# Patient Record
Sex: Male | Born: 1965 | Race: Black or African American | Hispanic: No | Marital: Married | Smoking: Never smoker
Health system: Southern US, Community
[De-identification: ages and names within clinical notes are randomized; demographics above are authoritative.]

## PROBLEM LIST (undated history)

## (undated) ENCOUNTER — Emergency Department (HOSPITAL_COMMUNITY): Disposition: A | Discharge status: Home / Self Care | Payer: Self-pay

## (undated) DIAGNOSIS — H269 Unspecified cataract: Secondary | ICD-10-CM

## (undated) DIAGNOSIS — J45909 Unspecified asthma, uncomplicated: Secondary | ICD-10-CM

## (undated) DIAGNOSIS — E119 Type 2 diabetes mellitus without complications: Secondary | ICD-10-CM

## (undated) DIAGNOSIS — I10 Essential (primary) hypertension: Secondary | ICD-10-CM

## (undated) DIAGNOSIS — H35039 Hypertensive retinopathy, unspecified eye: Secondary | ICD-10-CM

## (undated) HISTORY — PX: OTHER SURGICAL HISTORY: SHX169

## (undated) HISTORY — DX: Unspecified cataract: H26.9

## (undated) HISTORY — DX: Hypertensive retinopathy, unspecified eye: H35.039

## (undated) HISTORY — DX: Type 2 diabetes mellitus without complications: E11.9

---

## 2005-01-15 ENCOUNTER — Emergency Department (HOSPITAL_COMMUNITY): Admission: EM | Admit: 2005-01-15 | Discharge: 2005-01-15 | Payer: Self-pay | Admitting: Family Medicine

## 2006-06-01 ENCOUNTER — Emergency Department (HOSPITAL_COMMUNITY): Admission: EM | Admit: 2006-06-01 | Discharge: 2006-06-02 | Payer: Self-pay | Admitting: Emergency Medicine

## 2009-05-28 ENCOUNTER — Ambulatory Visit: Payer: Self-pay | Admitting: Vascular Surgery

## 2009-05-28 ENCOUNTER — Inpatient Hospital Stay (HOSPITAL_COMMUNITY)
Admission: EM | Admit: 2009-05-28 | Discharge: 2009-06-05 | Payer: Self-pay | Source: Home / Self Care | Admitting: Emergency Medicine

## 2009-05-28 ENCOUNTER — Encounter (INDEPENDENT_AMBULATORY_CARE_PROVIDER_SITE_OTHER): Payer: Self-pay | Admitting: Emergency Medicine

## 2009-05-28 ENCOUNTER — Ambulatory Visit: Payer: Self-pay | Admitting: Cardiovascular Disease

## 2009-05-30 ENCOUNTER — Encounter (INDEPENDENT_AMBULATORY_CARE_PROVIDER_SITE_OTHER): Payer: Self-pay | Admitting: Internal Medicine

## 2009-05-30 ENCOUNTER — Encounter (INDEPENDENT_AMBULATORY_CARE_PROVIDER_SITE_OTHER): Payer: Self-pay | Admitting: Family Medicine

## 2009-06-02 ENCOUNTER — Ambulatory Visit: Payer: Self-pay | Admitting: Infectious Diseases

## 2010-01-19 ENCOUNTER — Encounter: Admission: RE | Admit: 2010-01-19 | Payer: Self-pay | Source: Home / Self Care | Admitting: Endocrinology

## 2010-01-20 ENCOUNTER — Encounter
Admission: RE | Admit: 2010-01-20 | Discharge: 2010-01-20 | Payer: Self-pay | Source: Home / Self Care | Attending: Family Medicine | Admitting: Family Medicine

## 2010-03-23 LAB — CBC
HCT: 38.8 % — ABNORMAL LOW (ref 39.0–52.0)
HCT: 35.2 % — ABNORMAL LOW (ref 39.0–52.0)
HCT: 41.5 % (ref 39.0–52.0)
Hemoglobin: 13.3 g/dL (ref 13.0–17.0)
Hemoglobin: 11.8 g/dL — ABNORMAL LOW (ref 13.0–17.0)
Hemoglobin: 12.3 g/dL — ABNORMAL LOW (ref 13.0–17.0)
Hemoglobin: 16.1 g/dL (ref 13.0–17.0)
MCHC: 33.7 g/dL (ref 30.0–36.0)
MCHC: 34.3 g/dL (ref 30.0–36.0)
MCHC: 34.2 g/dL (ref 30.0–36.0)
MCHC: 34.5 g/dL (ref 30.0–36.0)
MCHC: 34.6 g/dL (ref 30.0–36.0)
MCHC: 34.9 g/dL (ref 30.0–36.0)
MCHC: 34.9 g/dL (ref 30.0–36.0)
MCV: 87.4 fL (ref 78.0–100.0)
MCV: 87.1 fL (ref 78.0–100.0)
MCV: 87.1 fL (ref 78.0–100.0)
MCV: 87.8 fL (ref 78.0–100.0)
MCV: 88 fL (ref 78.0–100.0)
Platelets: 258 10*3/uL (ref 150–400)
Platelets: 219 10*3/uL (ref 150–400)
Platelets: 247 10*3/uL (ref 150–400)
RBC: 3.9 MIL/uL — ABNORMAL LOW (ref 4.22–5.81)
RBC: 3.98 MIL/uL — ABNORMAL LOW (ref 4.22–5.81)
RBC: 4.01 MIL/uL — ABNORMAL LOW (ref 4.22–5.81)
RBC: 5.32 MIL/uL (ref 4.22–5.81)
RDW: 14.1 % (ref 11.5–15.5)
RDW: 14.4 % (ref 11.5–15.5)
RDW: 14.4 % (ref 11.5–15.5)
WBC: 9.6 10*3/uL (ref 4.0–10.5)
WBC: 13.3 10*3/uL — ABNORMAL HIGH (ref 4.0–10.5)

## 2010-03-23 LAB — COMPREHENSIVE METABOLIC PANEL
AST: 38 U/L — ABNORMAL HIGH (ref 0–37)
Albumin: 3.3 g/dL — ABNORMAL LOW (ref 3.5–5.2)
CO2: 25 mEq/L (ref 19–32)
Chloride: 107 mEq/L (ref 96–112)
Creatinine, Ser: 1.22 mg/dL (ref 0.4–1.5)
GFR calc Af Amer: >60 mL/min (ref >60)
GFR calc non Af Amer: >60 mL/min (ref >60)
Glucose, Bld: 109 mg/dL — ABNORMAL HIGH (ref 70–99)
Potassium: 3.7 mEq/L (ref 3.5–5.1)
Total Protein: 6.8 g/dL (ref 6.0–8.3)

## 2010-03-23 LAB — BASIC METABOLIC PANEL
BUN: 5 mg/dL — ABNORMAL LOW (ref 6–23)
BUN: 6 mg/dL (ref 6–23)
CO2: 26 mEq/L (ref 19–32)
CO2: 22 mEq/L (ref 19–32)
CO2: 24 mEq/L (ref 19–32)
CO2: 26 mEq/L (ref 19–32)
Calcium: 8.9 mg/dL (ref 8.4–10.5)
Calcium: 8.9 mg/dL (ref 8.4–10.5)
Chloride: 106 mEq/L (ref 96–112)
Chloride: 107 mEq/L (ref 96–112)
Creatinine, Ser: 1.02 mg/dL (ref 0.4–1.5)
GFR calc Af Amer: >60 mL/min (ref >60)
GFR calc Af Amer: >60 mL/min (ref >60)
GFR calc non Af Amer: >60 mL/min (ref >60)
GFR calc non Af Amer: >60 mL/min (ref >60)
Glucose, Bld: 99 mg/dL (ref 70–99)
Glucose, Bld: 104 mg/dL — ABNORMAL HIGH (ref 70–99)
Glucose, Bld: 115 mg/dL — ABNORMAL HIGH (ref 70–99)
Glucose, Bld: 123 mg/dL — ABNORMAL HIGH (ref 70–99)
Potassium: 3.8 mEq/L (ref 3.5–5.1)
Potassium: 3.4 mEq/L — ABNORMAL LOW (ref 3.5–5.1)
Potassium: 3.9 mEq/L (ref 3.5–5.1)
Sodium: 134 mEq/L — ABNORMAL LOW (ref 135–145)

## 2010-03-23 LAB — DIFFERENTIAL
Basophils Absolute: 0.1 10*3/uL (ref 0.0–0.1)
Basophils Relative: 0 % (ref 0–1)
Basophils Relative: 1 % (ref 0–1)
Eosinophils Absolute: 0 10*3/uL (ref 0.0–0.7)
Eosinophils Absolute: 0.2 10*3/uL (ref 0.0–0.7)
Eosinophils Relative: 1 % (ref 0–5)
Lymphs Abs: 2.6 10*3/uL (ref 0.7–4.0)
Monocytes Absolute: 1.5 10*3/uL — ABNORMAL HIGH (ref 0.1–1.0)
Monocytes Relative: 10 % (ref 3–12)
Neutrophils Relative %: 77 % (ref 43–77)

## 2010-03-23 LAB — PROTIME-INR
INR: 1.5 — ABNORMAL HIGH (ref 0.00–1.49)
INR: 1.67 — ABNORMAL HIGH (ref 0.00–1.49)
Prothrombin Time: 18.5 seconds — ABNORMAL HIGH (ref 11.6–15.2)
Prothrombin Time: 15.1 seconds (ref 11.6–15.2)
Prothrombin Time: 17.5 seconds — ABNORMAL HIGH (ref 11.6–15.2)
Prothrombin Time: 17.9 seconds — ABNORMAL HIGH (ref 11.6–15.2)

## 2010-03-23 LAB — HIV ANTIBODY (ROUTINE TESTING W REFLEX): HIV: NONREACTIVE

## 2010-03-23 LAB — ANAEROBIC CULTURE

## 2010-03-23 LAB — LIPID PANEL
HDL: 34 mg/dL — ABNORMAL LOW (ref >39)
Triglycerides: 114 mg/dL (ref <150)
VLDL: 23 mg/dL (ref 0–40)

## 2010-03-23 LAB — CULTURE, BLOOD (ROUTINE X 2): Culture: NO GROWTH

## 2010-03-23 LAB — RAPID URINE DRUG SCREEN, HOSP PERFORMED
Amphetamines: NOT DETECTED
Cocaine: POSITIVE — AB
Opiates: NOT DETECTED
Tetrahydrocannabinol: NOT DETECTED

## 2010-03-23 LAB — WOUND CULTURE

## 2010-03-23 LAB — ETHANOL: Alcohol, Ethyl (B): <5 mg/dL (ref 0–10)

## 2010-03-23 LAB — SEDIMENTATION RATE: Sed Rate: 35 mm/hr — ABNORMAL HIGH (ref 0–16)

## 2010-03-23 LAB — VANCOMYCIN, TROUGH: Vancomycin Tr: 5.9 ug/mL — ABNORMAL LOW (ref 10.0–20.0)

## 2012-04-30 IMAGING — CT CT HEAD W/O CM
1 of 2 series · 13 of 30 positions shown, 17 images · non-contrast
Comparison: None.

CLINICAL DATA: Unsteady gait and diffuse weakness.

CT HEAD WITHOUT CONTRAST
TECHNIQUE: Contiguous axial images were obtained from the base of
the skull through the vertex without contrast.

[Series 2: brain · axial · 0.49mm/px · z∈[+177,+311]mm · 13 of 32 slices shown, 17 images]
[im 3/32  brain]
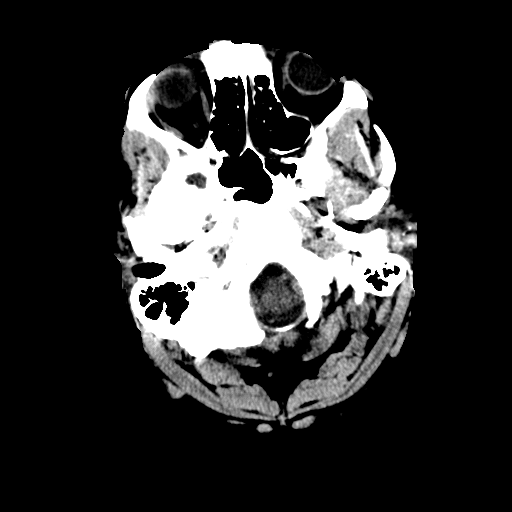
[im 3/32  bone]
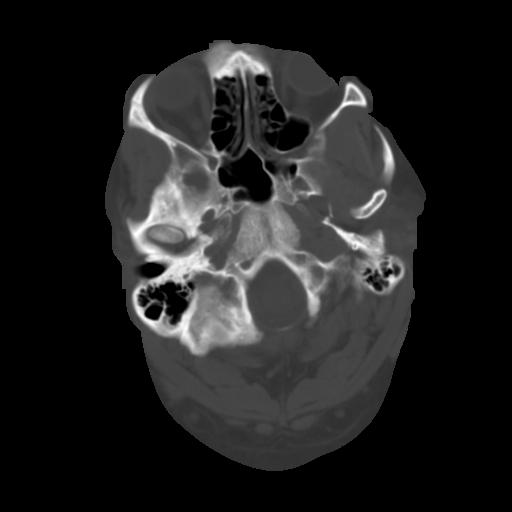
[im 5/32  brain]
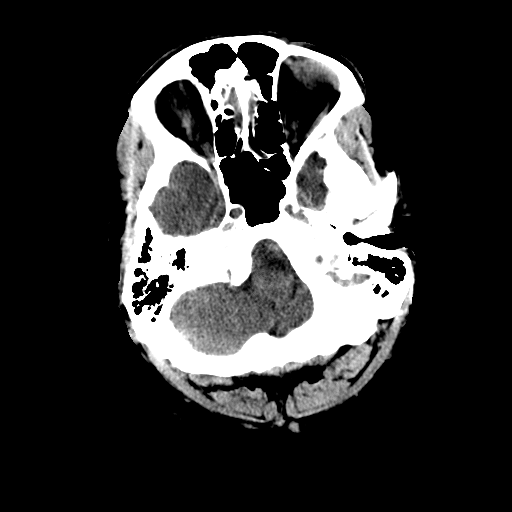
[im 7/32  brain]
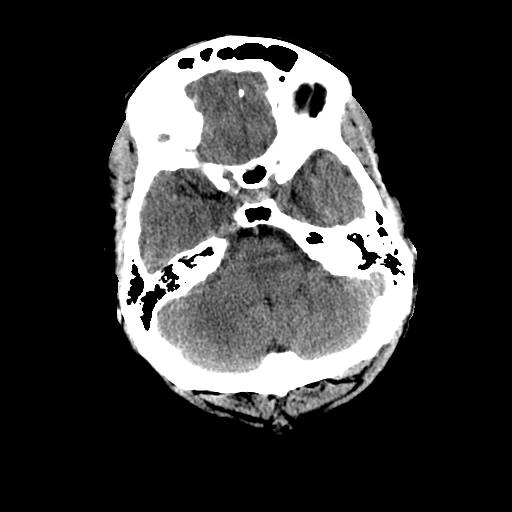
[im 9/32  brain]
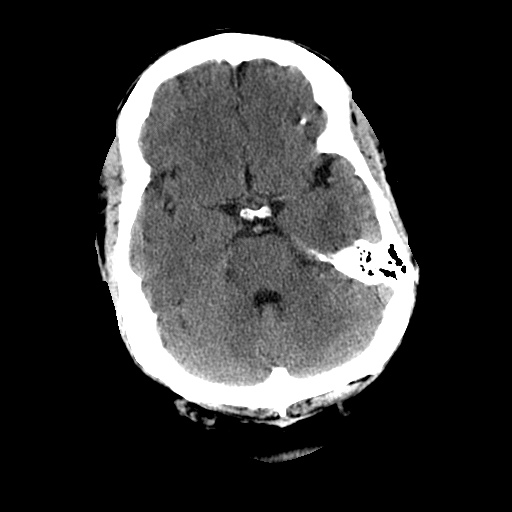
[im 12/32  brain]
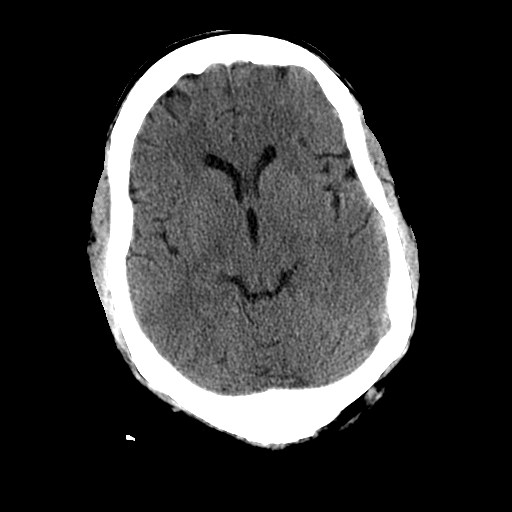
[im 12/32  bone]
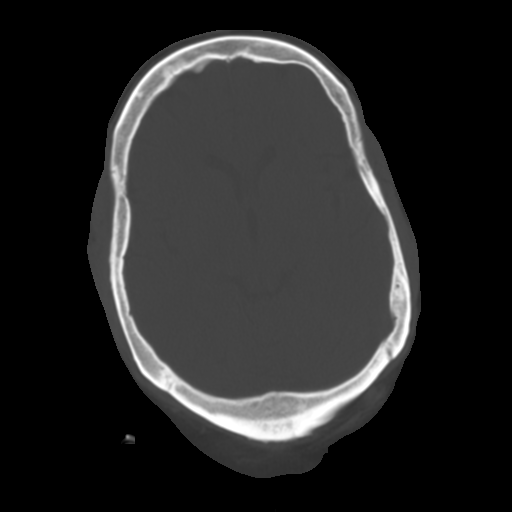
[im 14/32  brain]
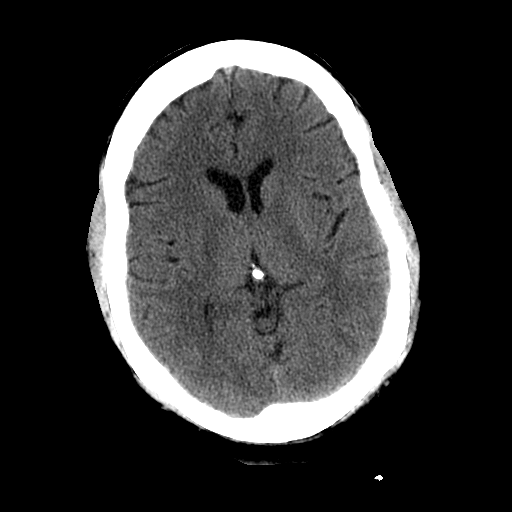
[im 16/32  brain]
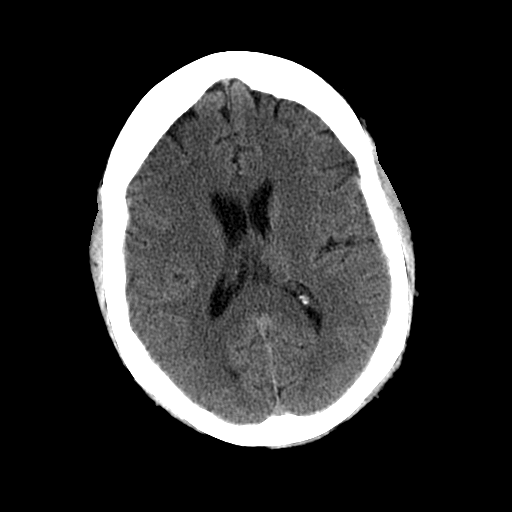
[im 18/32  brain]
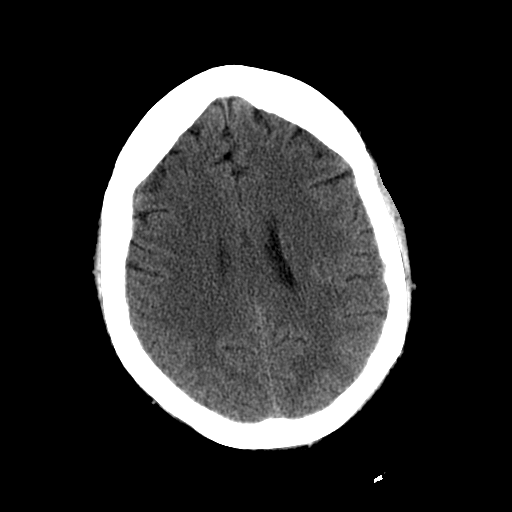
[im 20/32  brain]
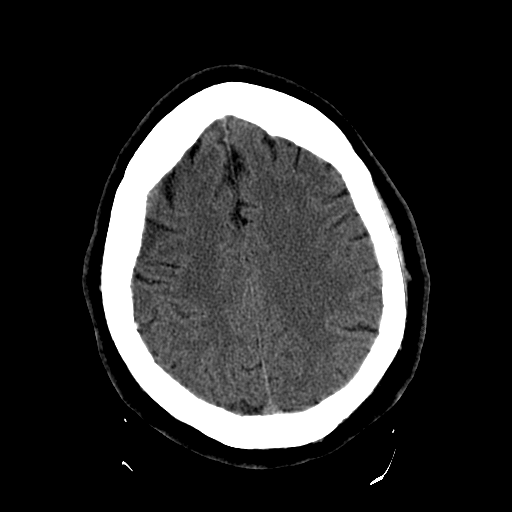
[im 20/32  bone]
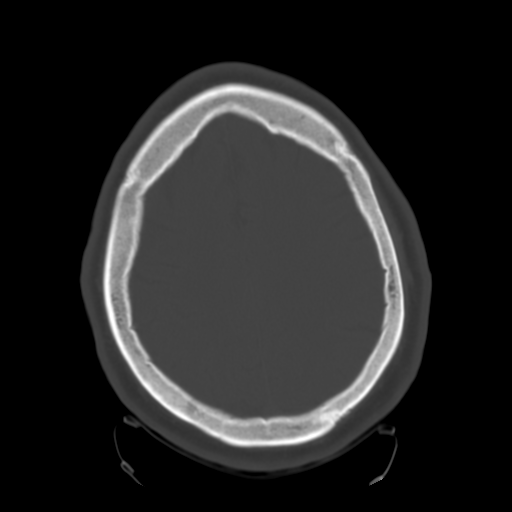
[im 23/32  brain]
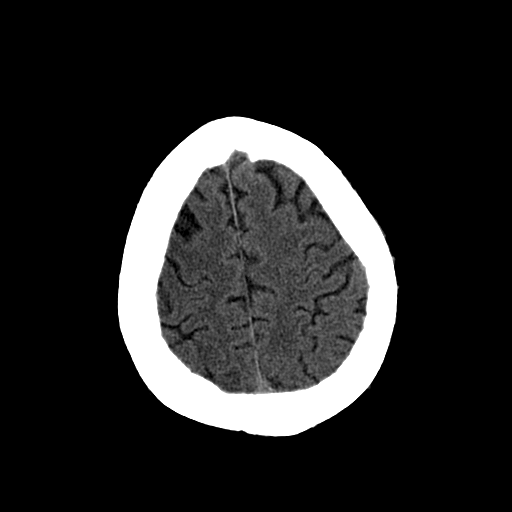
[im 25/32  brain]
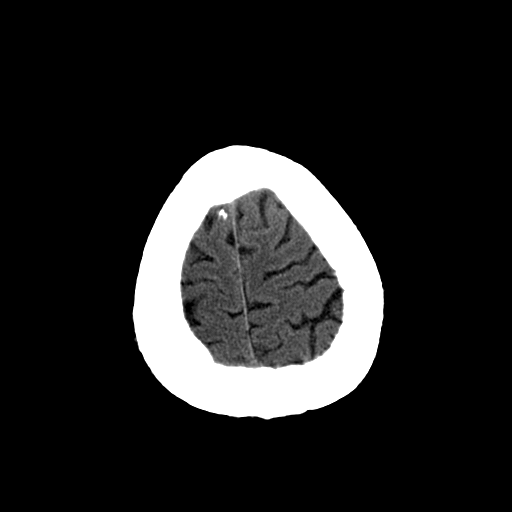
[im 27/32  brain]
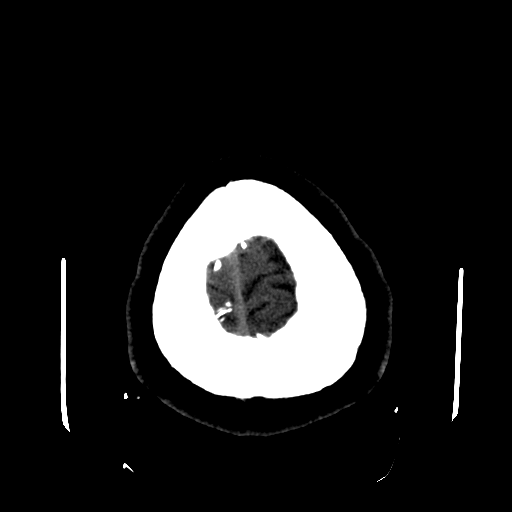
[im 29/32  brain]
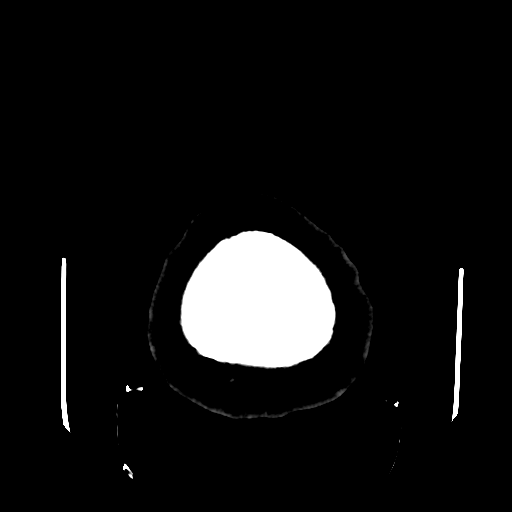
[im 29/32  bone]
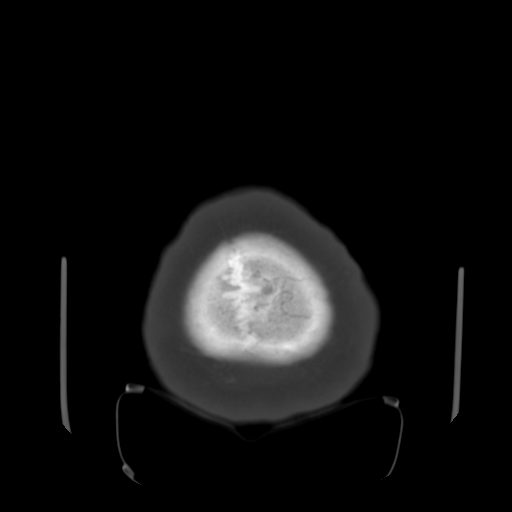

[13 of 30 positions shown; findings below may reference images not displayed]

FINDINGS: There is no evidence of acute infarction, mass lesion, or
intra- or extra-axial hemorrhage on CT.

The posterior fossa, including the cerebellum, brainstem and fourth
ventricle, is within normal limits.  The third and lateral
ventricles, and basal ganglia are unremarkable in appearance.  The
cerebral hemispheres are symmetric in appearance, with normal gray-
white differentiation.  No mass effect or midline shift is seen.

There is no evidence of fracture; visualized osseous structures are
unremarkable in appearance.  The visualized portions of the orbits
are within normal limits.  The paranasal sinuses and mastoid air
cells are well-aerated.  No significant soft tissue abnormalities
are seen. Mild focal soft tissue calcifications are noted at a skin
fold overlying the occiput.
IMPRESSION: Unremarkable noncontrast CT of the head.

## 2012-12-23 IMAGING — US US EXTREM  UP VENOUS*L*
1 series · 14 of 24 positions shown · non-contrast
Comparison: None.

CLINICAL DATA: History of left upper extremity DVT and the
brachial vein.  Follow up after anticoagulation therapy.

LEFT UPPER EXTREMITY VENOUS DOPPLER ULTRASOUND 01/20/2010:
TECHNIQUE: Gray-scale sonography with compression, as well as
color and duplex Doppler ultrasound, were performed to evaluate the
deep venous system from the level of  the elbow centrally to
include the subclavian vein.  The left internal jugular vein was
also evaluated.  Evaluation also included physiologic response to
augmentation.

[Series 1: us extrem up venous*left* · 14 of 36 slices shown]
[im 1/36]
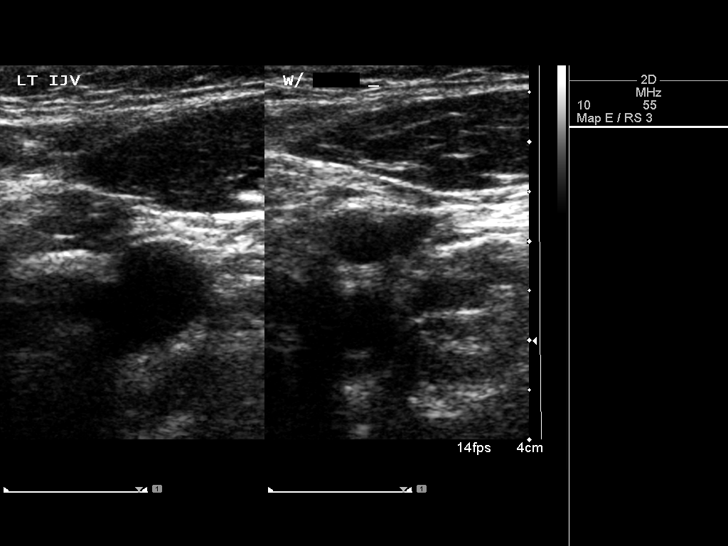
[im 4/36]
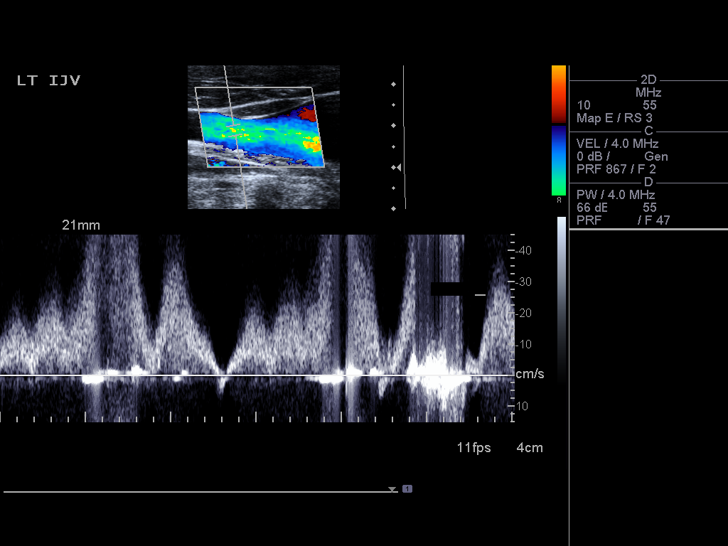
[im 7/36]
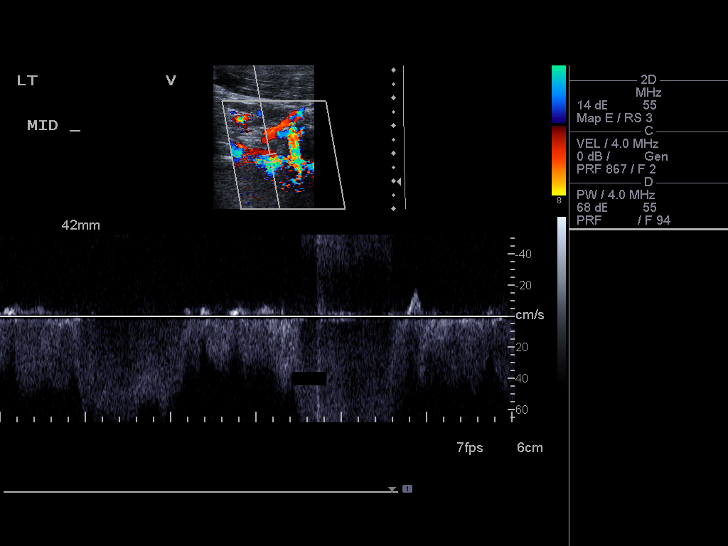
[im 10/36]
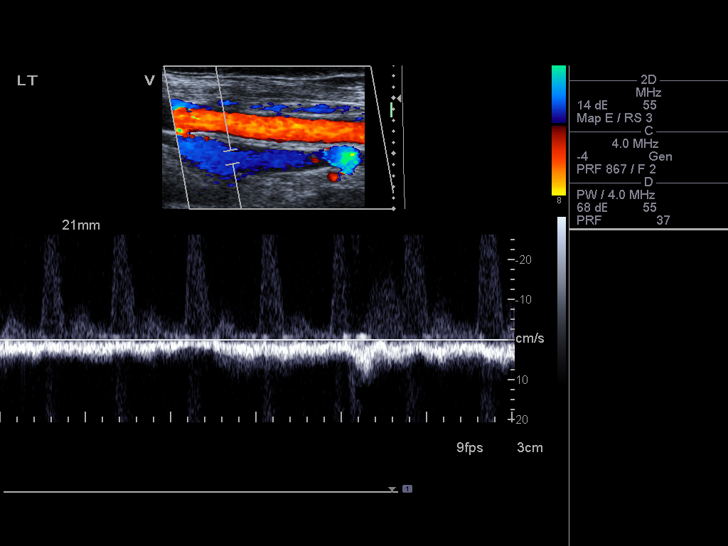
[im 11/36]
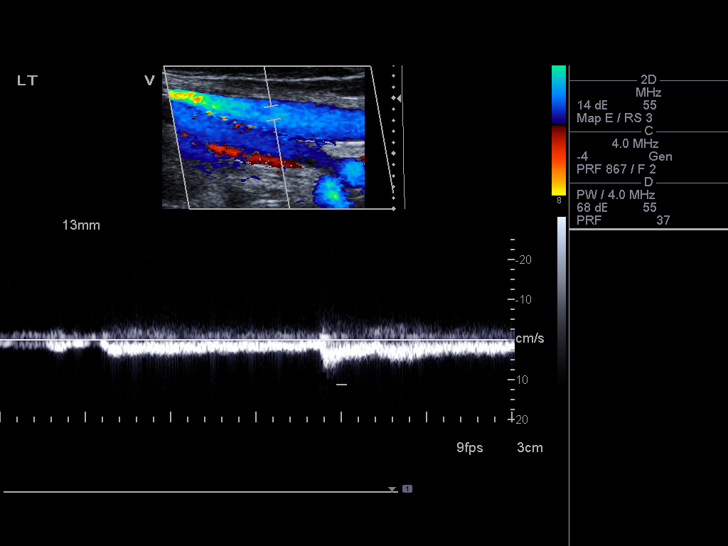
[im 14/36]
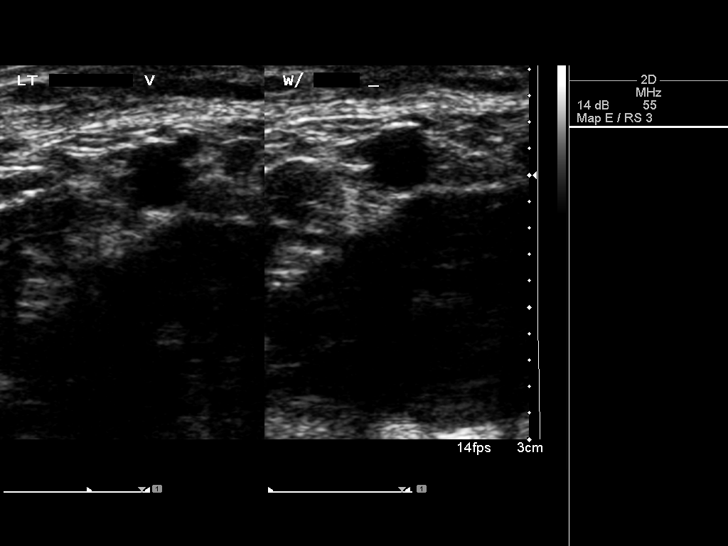
[im 17/36]
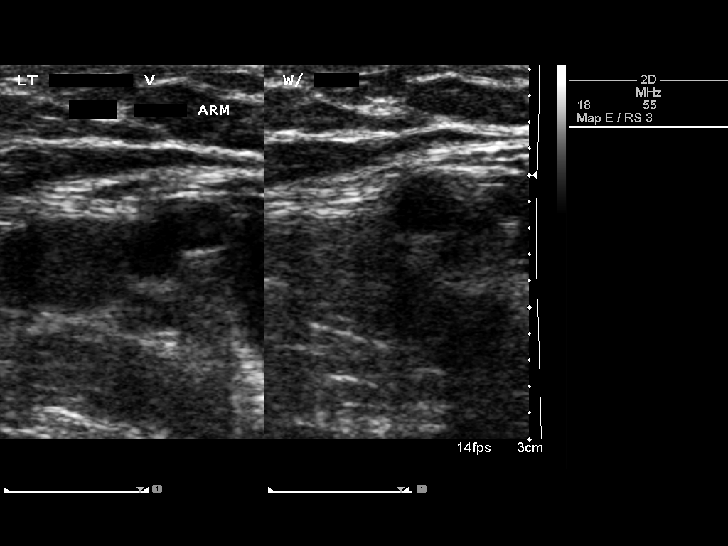
[im 19/36]
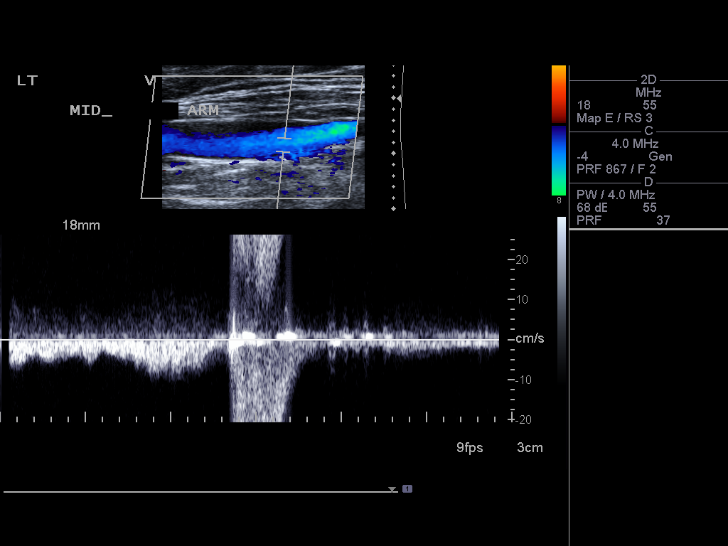
[im 22/36]
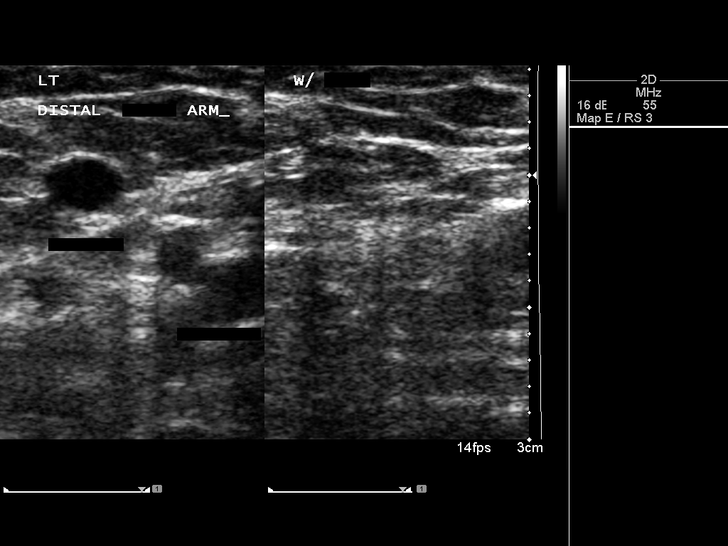
[im 25/36]
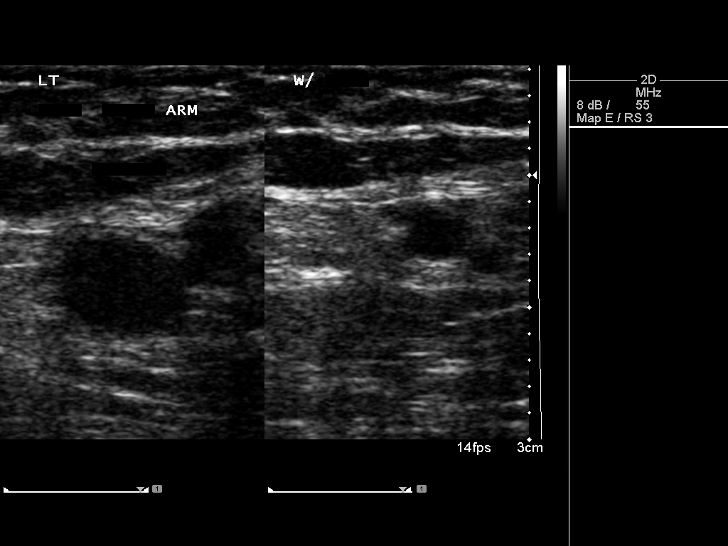
[im 28/36]
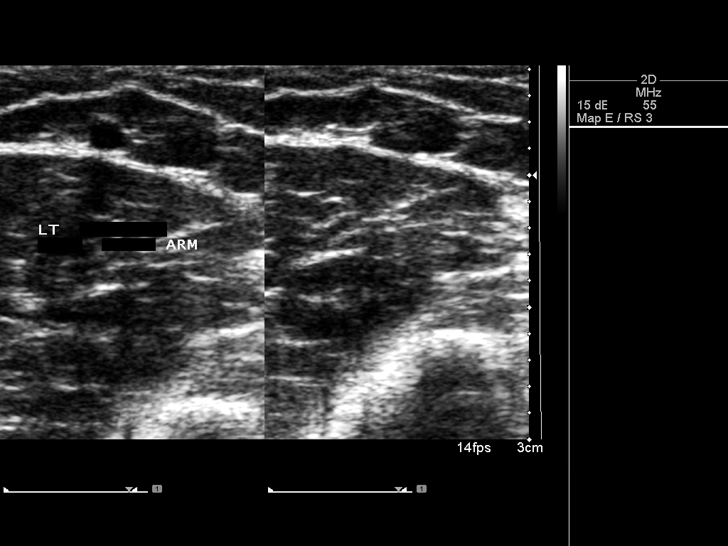
[im 29/36]
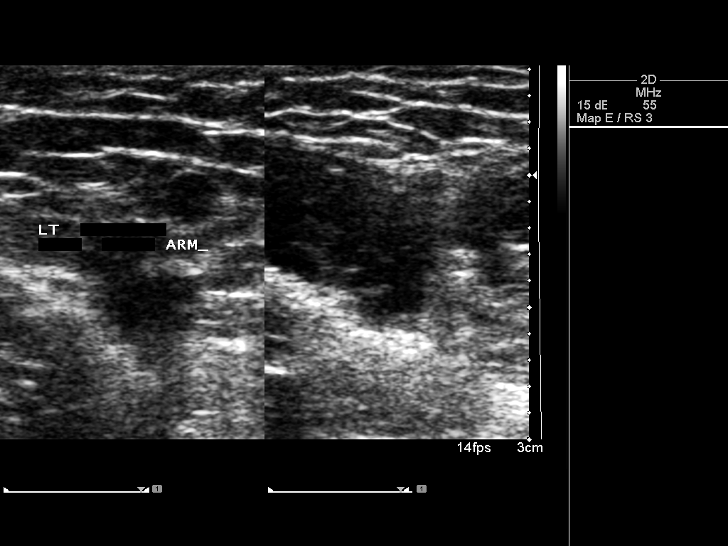
[im 32/36]
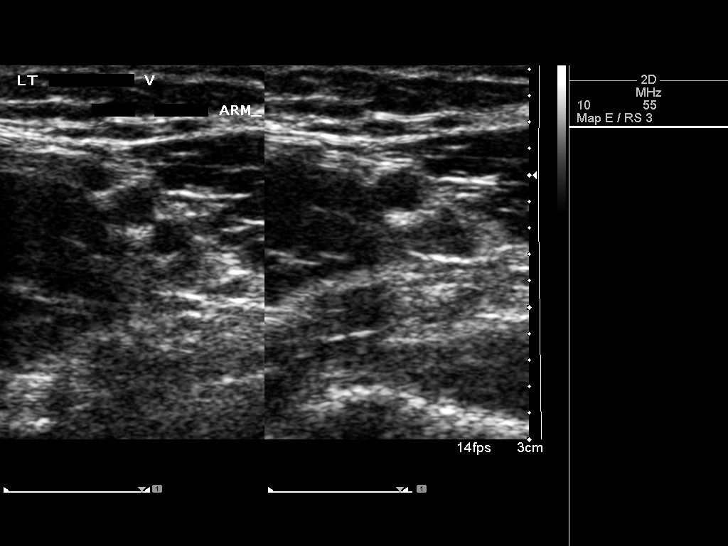
[im 36/36]
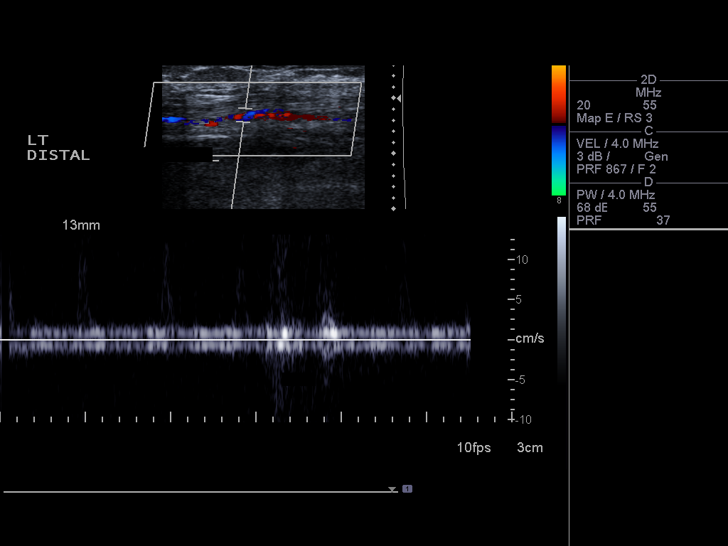

[14 of 24 positions shown; findings below may reference images not displayed]

FINDINGS: All of the visualized deep veins in the left upper
extremity demonstrate normal Doppler signal, normal
compressibility, normal phasicity, and normal physiologic response
to augmentation.  No gray scale filling defects were identified.
The left internal jugular vein is similarly patent.  Specifically,
no evidence of residual DVT involving the left brachial vein.
IMPRESSION: No evidence of left upper extremity DVT.  Specifically, no evidence
of residual DVT in the left brachial vein.

## 2015-03-26 ENCOUNTER — Other Ambulatory Visit: Payer: Self-pay | Admitting: Family Medicine

## 2015-03-26 DIAGNOSIS — M79652 Pain in left thigh: Secondary | ICD-10-CM

## 2015-03-31 ENCOUNTER — Ambulatory Visit
Admission: RE | Admit: 2015-03-31 | Discharge: 2015-03-31 | Disposition: A | Discharge status: Home / Self Care | Payer: 59 | Source: Ambulatory Visit | Attending: Family Medicine | Admitting: Family Medicine

## 2015-03-31 DIAGNOSIS — M79652 Pain in left thigh: Secondary | ICD-10-CM

## 2016-02-29 ENCOUNTER — Encounter (HOSPITAL_COMMUNITY): Payer: Self-pay | Admitting: *Deleted

## 2016-02-29 ENCOUNTER — Ambulatory Visit (HOSPITAL_COMMUNITY)
Admission: EM | Admit: 2016-02-29 | Discharge: 2016-02-29 | Disposition: A | Discharge status: Home / Self Care | Payer: Worker's Compensation | Attending: Family Medicine | Admitting: Family Medicine

## 2016-02-29 DIAGNOSIS — I10 Essential (primary) hypertension: Secondary | ICD-10-CM | POA: Diagnosis not present

## 2016-02-29 DIAGNOSIS — M25562 Pain in left knee: Secondary | ICD-10-CM

## 2016-02-29 HISTORY — DX: Essential (primary) hypertension: I10

## 2016-02-29 HISTORY — DX: Unspecified asthma, uncomplicated: J45.909

## 2016-02-29 MED ORDER — NAPROXEN 500 MG PO TABS: 500.0000 mg | ORAL_TABLET | Freq: Two times a day (BID) | ORAL | 0 refills | Status: AC

## 2016-02-29 MED ORDER — TRAMADOL HCL 50 MG PO TABS: 50.0000 mg | ORAL_TABLET | Freq: Four times a day (QID) | ORAL | 0 refills | Status: AC | PRN

## 2016-02-29 NOTE — ED Provider Notes (Signed)
CSN: [id number]     Arrival date & time 02/29/16  1402 History   First MD Initiated Contact with Patient 02/29/16 1436     Chief Complaint  Patient presents with  . Knee Pain   (Consider location/radiation/quality/duration/timing/severity/associated sxs/prior Treatment) Very pleasant 51 yo gentleman presents with left knee pain. He fell at work a month ago and at that time [place] Xrays were reported as normal. He continues to have pain, especially at night and therefore when he called his place of employment he was told to come here. He denies swelling, erythema or warmth. Pain with ambulation is noted. He feels most of his pain is medial and along the front. Pain with flexion is also noted.       Past Medical History:  Diagnosis Date  . Asthma   . Hypertension    Past Surgical History:  Procedure Laterality Date  . arm surgery     No family history on file. Social History  Substance Use Topics  . Smoking status: Never Smoker  . Smokeless tobacco: Not on file  . Alcohol use No    Review of Systems  All other systems reviewed and are negative.   Allergies  Patient has no known allergies.  Home Medications   Prior to Admission medications   Medication Sig Start Date End Date Taking? Authorizing Provider  albuterol (PROVENTIL HFA;VENTOLIN HFA) 108 (90 Base) MCG/ACT inhaler Inhale into the lungs every 6 (six) hours as needed for wheezing or shortness of breath.   Yes Historical Provider, MD  naproxen (NAPROSYN) 500 MG tablet Take 1 tablet (500 mg total) by mouth 2 (two) times daily with a meal. 02/29/16   Riki SheerMichelle G Madox Corkins, PA-C  traMADol (ULTRAM) 50 MG tablet Take 1 tablet (50 mg total) by mouth every 6 (six) hours as needed. 02/29/16   Riki SheerMichelle G Yasmeen Manka, PA-C   Meds Ordered and Administered this Visit  Medications - No data to display  BP 152/87   Pulse 80   Temp 98 F (36.7 C) (Oral)   Resp 16   SpO2 97%  No data found.   Physical Exam   Constitutional: He is oriented to person, place, and time. He appears well-developed and well-nourished. No distress.  Musculoskeletal: He exhibits tenderness. He exhibits no edema or deformity.  Left knee without deformity or effusion. Full ROM with pain to full flexion. Tenderness along medial joint line. No instability noted  Neurological: He is alert and oriented to person, place, and time.  Skin: Skin is warm and dry. He is not diaphoretic.  Psychiatric: His behavior is normal.  Nursing note and vitals reviewed.   Urgent Care Course     Procedures (including critical care time)  Labs Review Labs Reviewed - No data to display  Imaging Review No results found.   Visual Acuity Review  Right Eye Distance:   Left Eye Distance:   Bilateral Distance:    Right Eye Near:   Left Eye Near:    Bilateral Near:         MDM   1. Acute pain of left knee   2. Essential hypertension    1. S/P 1 month our from Work injury. Patient with ongoing left knee pain. Will treat with NSAID's and prn Tramadol as needed. No instability by exam, but suggest that he have an Orthopedic evaluation at this time based on lingering symptoms and need for further imaging.  He is given a note with this  information.  2. Watch BP with Naprosyn and keep f/u with PCP.     Riki Sheer, PA-C 02/29/16 1501

## 2016-02-29 NOTE — Discharge Instructions (Signed)
So sorry you are still having pain. Please call your work [employer] and ask them to refer you to Orthopedics for further evaluation and possible MRI. The Naprosyn will help with inflammation and pain, the Tramadol is stronger and can be used for night pain if needed. Feel better. Keep an eye on your blood pressure while on Naprosyn and f/u with PCP for elevated BP.

## 2016-02-29 NOTE — ED Triage Notes (Signed)
Reports falling onto left knee at work approx 1 month ago after slipping on ice.  [place].  States continuing to have significant left knee pain, which wakes him during night.

## 2016-10-19 DIAGNOSIS — Z23 Encounter for immunization: Secondary | ICD-10-CM | POA: Diagnosis not present

## 2016-12-21 DIAGNOSIS — N401 Enlarged prostate with lower urinary tract symptoms: Secondary | ICD-10-CM | POA: Diagnosis not present

## 2016-12-21 DIAGNOSIS — Z0001 Encounter for general adult medical examination with abnormal findings: Secondary | ICD-10-CM | POA: Diagnosis not present

## 2016-12-21 DIAGNOSIS — I10 Essential (primary) hypertension: Secondary | ICD-10-CM | POA: Diagnosis not present

## 2016-12-21 DIAGNOSIS — J45909 Unspecified asthma, uncomplicated: Secondary | ICD-10-CM | POA: Diagnosis not present

## 2016-12-21 DIAGNOSIS — Z125 Encounter for screening for malignant neoplasm of prostate: Secondary | ICD-10-CM | POA: Diagnosis not present

## 2017-03-01 ENCOUNTER — Encounter: Payer: Self-pay | Admitting: Infectious Diseases

## 2017-03-01 DIAGNOSIS — R7301 Impaired fasting glucose: Secondary | ICD-10-CM | POA: Diagnosis not present

## 2017-03-01 DIAGNOSIS — N401 Enlarged prostate with lower urinary tract symptoms: Secondary | ICD-10-CM | POA: Diagnosis not present

## 2017-03-01 DIAGNOSIS — I10 Essential (primary) hypertension: Secondary | ICD-10-CM | POA: Diagnosis not present

## 2017-04-07 ENCOUNTER — Encounter: Payer: 59 | Attending: Family Medicine | Admitting: Dietician

## 2017-04-07 ENCOUNTER — Encounter: Payer: Self-pay | Admitting: Dietician

## 2017-04-07 DIAGNOSIS — Z713 Dietary counseling and surveillance: Secondary | ICD-10-CM | POA: Diagnosis not present

## 2017-04-07 DIAGNOSIS — E119 Type 2 diabetes mellitus without complications: Secondary | ICD-10-CM | POA: Diagnosis not present

## 2017-04-07 NOTE — Progress Notes (Signed)
Patient was seen on 04/07/17 for the first of a series of three diabetes self-management courses at [facility]  Patient Education Plan per assessed needs and concerns is to attend three course education program for Diabetes Self Management Education.  The following learning objectives were met by the patient during this class:  Describe diabetes  State some common risk factors for diabetes  Defines the role of glucose and insulin  Identifies type of diabetes and pathophysiology  Describe the relationship between diabetes and cardiovascular risk  State the members of the Healthcare Team  States the rationale for glucose monitoring  State when to test glucose  State their individual Target Range  State the importance of logging glucose readings  Describe how to interpret glucose readings  Identifies A1C target  Explain the correlation between A1c and eAG values  State symptoms and treatment of high blood glucose  State symptoms and treatment of low blood glucose  Explain proper technique for glucose testing  Identifies proper sharps disposal  Handouts given during class include:  Living Well with Diabetes book  Carb Counting and Meal Planning book  Meal Plan Card  Meal planning worksheet  Low Sodium Flavoring Tips  Types of Fats  The diabetes portion plate  B6L to eAG Conversion Chart  Diabetes Recommended Care Schedule  Support Group  Diabetes Success Plan  Core Class Satisfaction Survey   Follow-Up Plan:  Attend core 2

## 2017-04-14 ENCOUNTER — Encounter: Payer: 59 | Admitting: Dietician

## 2017-04-14 DIAGNOSIS — E119 Type 2 diabetes mellitus without complications: Secondary | ICD-10-CM

## 2017-04-14 DIAGNOSIS — Z713 Dietary counseling and surveillance: Secondary | ICD-10-CM | POA: Diagnosis not present

## 2017-04-14 NOTE — Progress Notes (Signed)
Patient was seen on 04/14/17 for the second of a series of three diabetes self-management courses at the [facility]. The following learning objectives were met by the patient during this class:   Describe the role of different macronutrients on glucose  Explain how carbohydrates affect blood glucose  State what foods contain the most carbohydrates  Demonstrate carbohydrate counting  Demonstrate how to read Nutrition Facts food label  Describe effects of various fats on heart health  Describe the importance of good nutrition for health and healthy eating strategies  Describe techniques for managing your shopping, cooking and meal planning  List strategies to follow meal plan when dining out  Describe the effects of alcohol on glucose and how to use it safely  Goals:  Follow Diabetes Meal Plan as instructed  Aim to spread carbs evenly throughout the day  Aim for 3 meals per day and snacks as needed Include lean protein foods to meals/snacks  Monitor glucose levels as instructed by your doctor   Follow-Up Plan:  Attend Core 3  Work towards following your personal food plan.

## 2017-04-21 ENCOUNTER — Encounter: Payer: 59 | Admitting: Dietician

## 2017-04-21 DIAGNOSIS — E119 Type 2 diabetes mellitus without complications: Secondary | ICD-10-CM

## 2017-04-21 DIAGNOSIS — Z713 Dietary counseling and surveillance: Secondary | ICD-10-CM | POA: Diagnosis not present

## 2017-04-21 NOTE — Progress Notes (Signed)
Patient was seen on 04/21/17 for the third of a series of three diabetes self-management courses at the [facility].   Janene Madeira. State the amount of activity recommended for healthy living . Describe activities suitable for individual needs . Identify ways to regularly incorporate activity into daily life . Identify barriers to activity and ways to over come these barriers  Identify diabetes medications being personally used and their primary action for lowering glucose and possible side effects . Describe role of stress on blood glucose and develop strategies to address psychosocial issues . Identify diabetes complications and ways to prevent them  Explain how to manage diabetes during illness . Evaluate success in meeting personal goal . Establish 2-3 goals that they will plan to diligently work on  Goals:   I will be active 45 minutes or more 3 times a week  I will take my diabetes medications as scheduled  I will test my glucose at least 2 times a day, 7 days a week  To help manage stress I will play with grandson at least 1 time a week  Your patient has identified these potential barriers to change:  None  Your patient has identified their diabetes self-care support plan as  On-line Resources   Plan:  Attend Support Group as desired

## 2017-05-04 DIAGNOSIS — E119 Type 2 diabetes mellitus without complications: Secondary | ICD-10-CM | POA: Diagnosis not present

## 2017-11-14 DIAGNOSIS — R109 Unspecified abdominal pain: Secondary | ICD-10-CM | POA: Diagnosis not present

## 2017-12-14 DIAGNOSIS — N401 Enlarged prostate with lower urinary tract symptoms: Secondary | ICD-10-CM | POA: Diagnosis not present

## 2017-12-14 DIAGNOSIS — E119 Type 2 diabetes mellitus without complications: Secondary | ICD-10-CM | POA: Diagnosis not present

## 2017-12-14 DIAGNOSIS — R1030 Lower abdominal pain, unspecified: Secondary | ICD-10-CM | POA: Diagnosis not present

## 2018-01-25 DIAGNOSIS — Z0000 Encounter for general adult medical examination without abnormal findings: Secondary | ICD-10-CM | POA: Diagnosis not present

## 2018-01-25 DIAGNOSIS — Z23 Encounter for immunization: Secondary | ICD-10-CM | POA: Diagnosis not present

## 2018-01-25 DIAGNOSIS — E119 Type 2 diabetes mellitus without complications: Secondary | ICD-10-CM | POA: Diagnosis not present

## 2018-01-25 DIAGNOSIS — Z1159 Encounter for screening for other viral diseases: Secondary | ICD-10-CM | POA: Diagnosis not present

## 2018-01-25 DIAGNOSIS — I10 Essential (primary) hypertension: Secondary | ICD-10-CM | POA: Diagnosis not present

## 2018-03-01 DIAGNOSIS — R768 Other specified abnormal immunological findings in serum: Secondary | ICD-10-CM | POA: Diagnosis not present

## 2018-03-03 IMAGING — US US EXTREM LOW VENOUS*L*
1 series · 14 of 24 positions shown · non-contrast
Comparison: None

CLINICAL DATA: Left thigh pain x1 week

EXAM:
LEFT LOWER EXTREMITY VENOUS DOPPLER ULTRASOUND
TECHNIQUE: Gray-scale sonography with compression, as well as color and duplex
ultrasound, were performed to evaluate the deep venous system from
the level of the common femoral vein through the popliteal and
proximal calf veins.

[Series 1: us extrem low venous*left* · 14 of 32 slices shown]
[im 1/32]
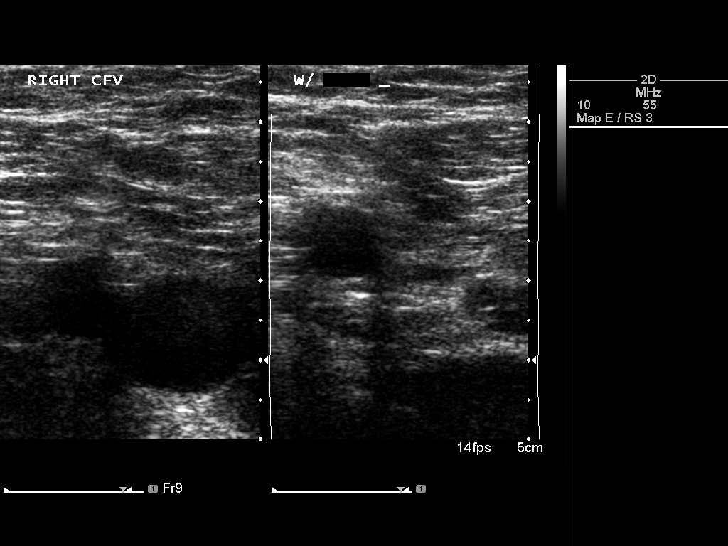
[im 3/32]
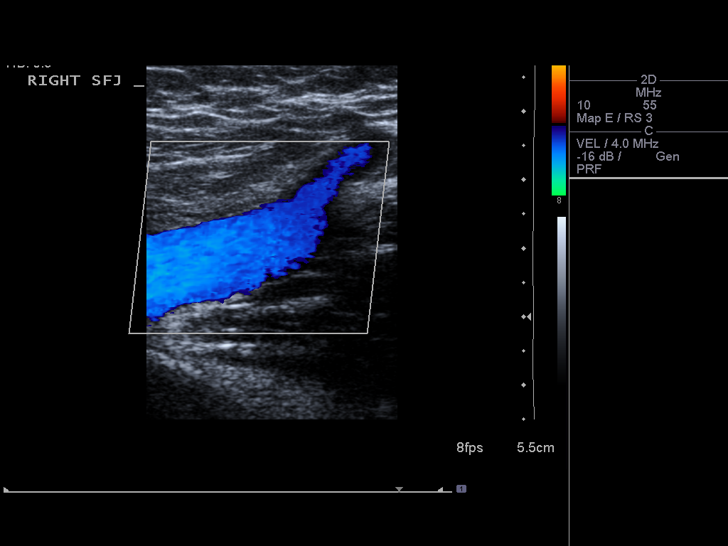
[im 6/32]
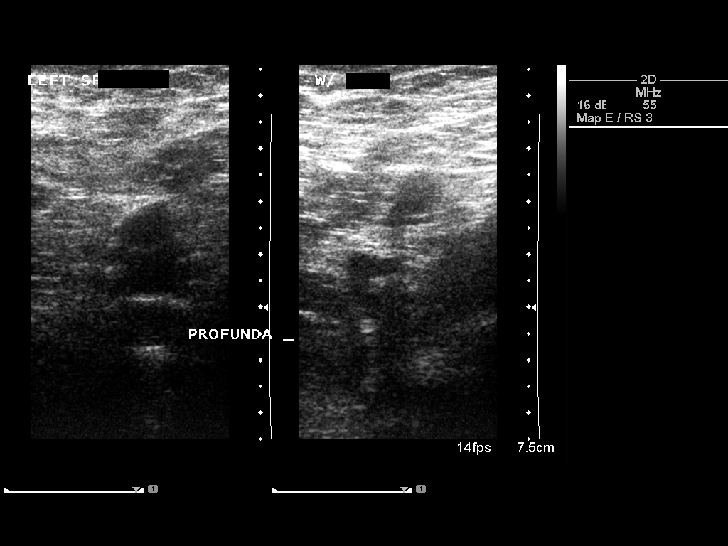
[im 9/32]
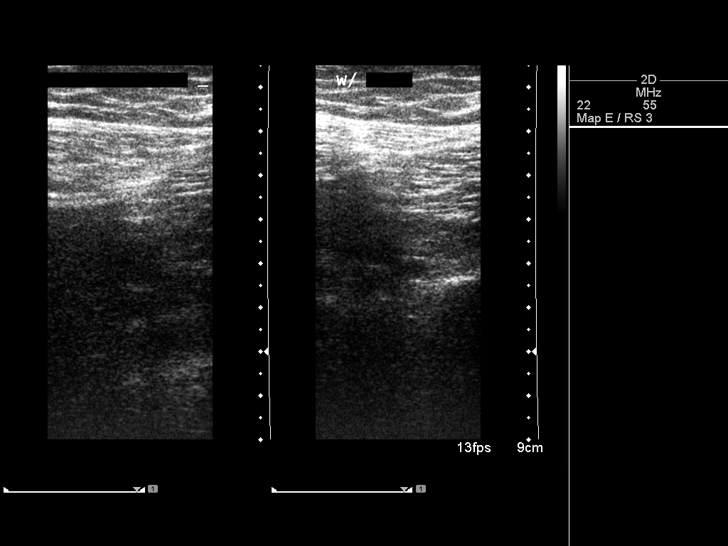
[im 10/32]
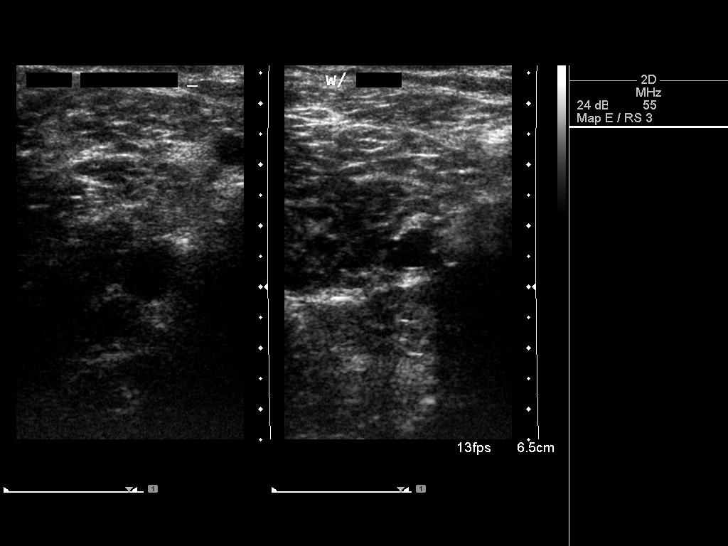
[im 13/32]
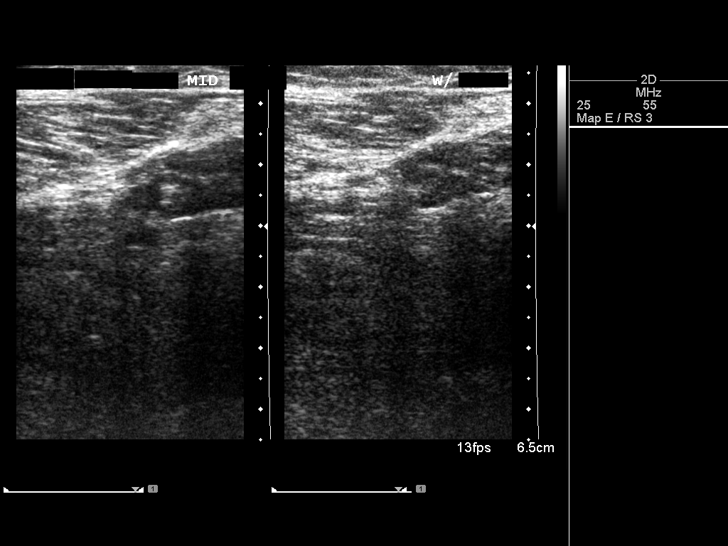
[im 15/32]
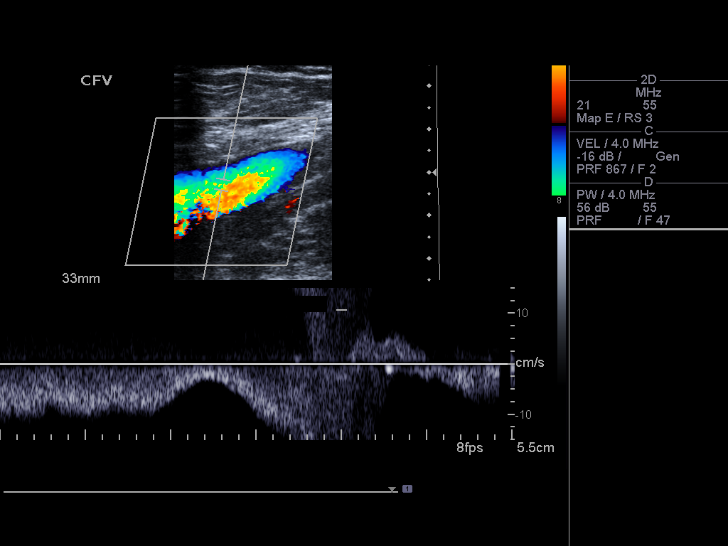
[im 17/32]
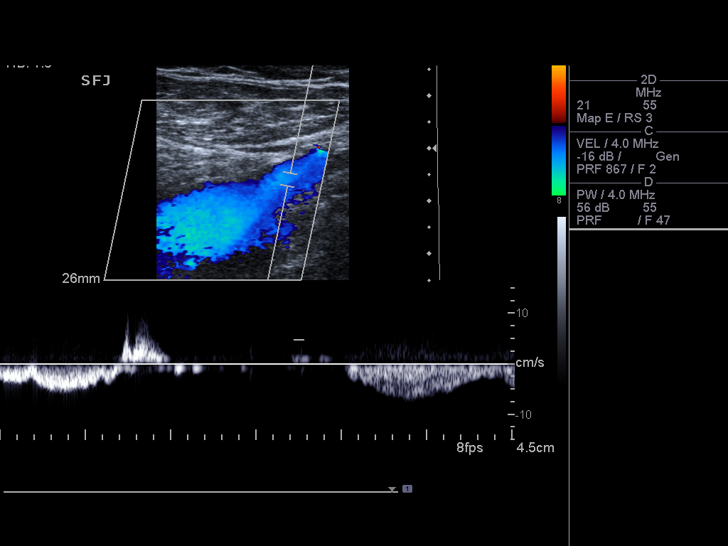
[im 19/32]
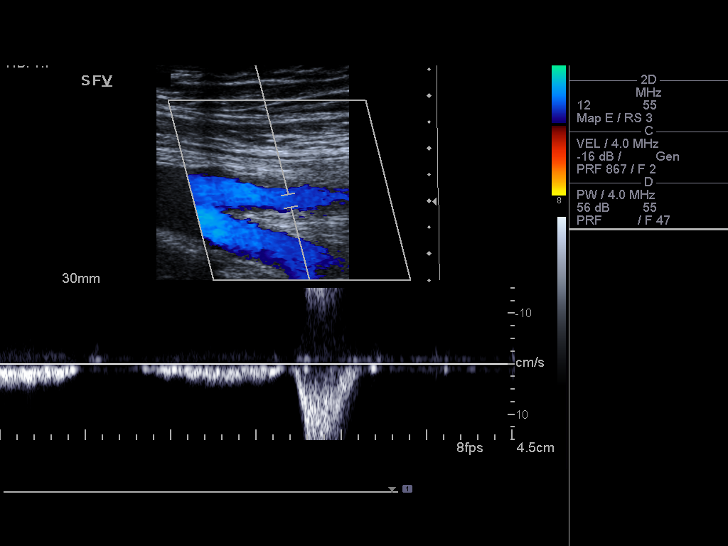
[im 22/32]
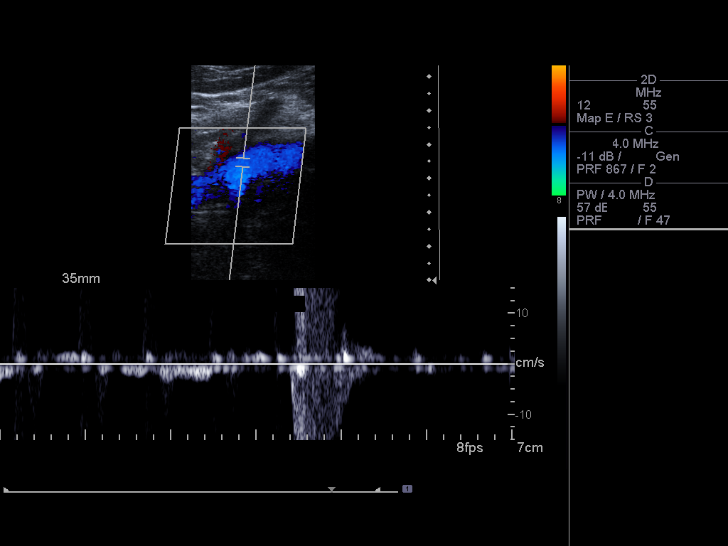
[im 25/32]
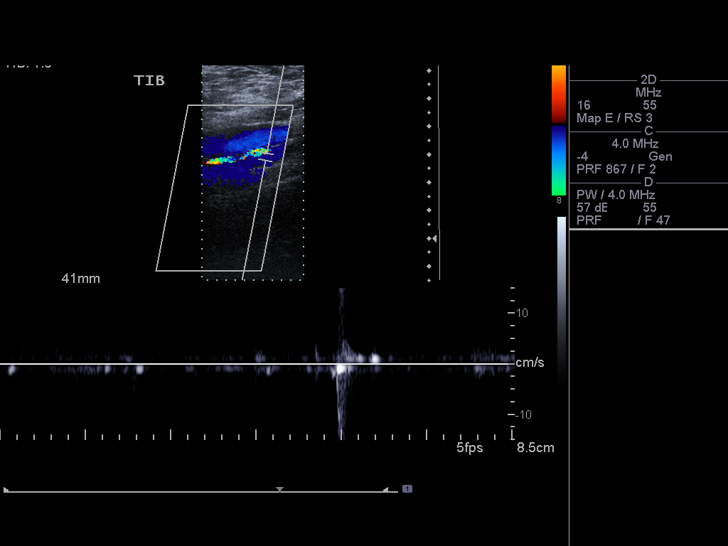
[im 26/32]
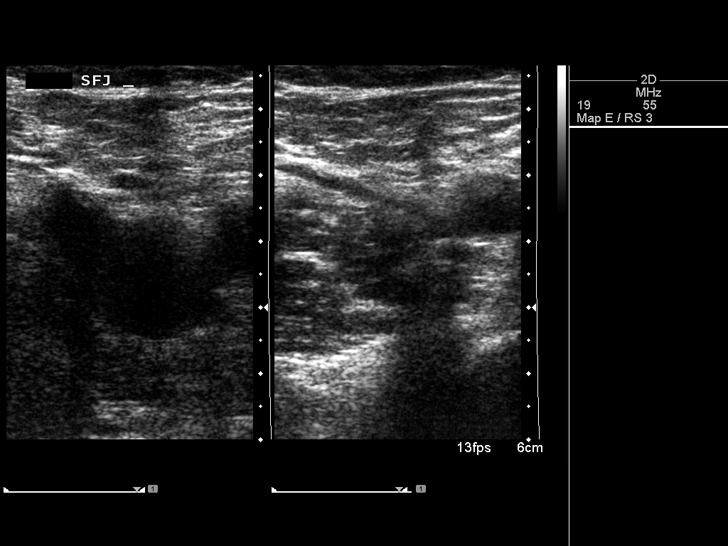
[im 29/32]
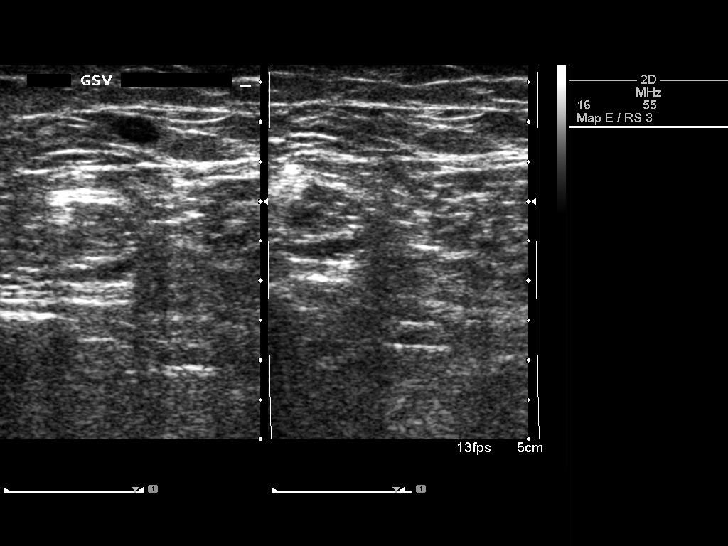
[im 32/32]
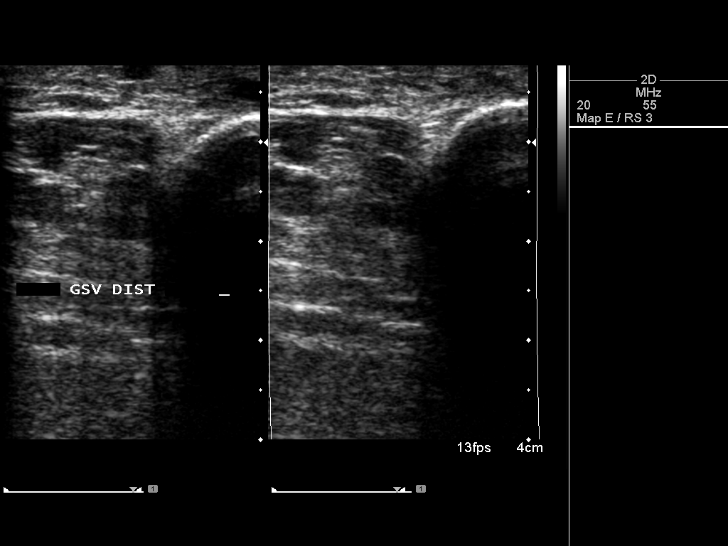

[14 of 24 positions shown; findings below may reference images not displayed]

FINDINGS: Normal compressibility of the common femoral, superficial femoral,
and popliteal veins, as well as the proximal calf veins. No filling
defects to suggest DVT on grayscale or color Doppler imaging.
Doppler waveforms show normal direction of venous flow, normal
respiratory phasicity and response to augmentation. Visualized
segments of the saphenous venous system normal in caliber and
compressibility. Survey views of the contralateral common femoral
vein are unremarkable.
IMPRESSION: 1. No evidence of lower extremity deep vein thrombosis, left.

## 2018-09-04 NOTE — Progress Notes (Signed)
[facility] - Clinic Note  09/05/2018     CHIEF COMPLAINT Patient presents for Diabetic Eye Exam   HISTORY OF PRESENT ILLNESS: Alex Reyes is a 53 y.o. male who presents to the clinic today for:   HPI    Diabetic Eye Exam    Vision is blurred for near and is blurred for distance.  Associated Symptoms Floaters, Glare and Photophobia.  Diabetes characteristics include Type 2 and taking oral medications.  This started 1 year ago.  Blood sugar level is controlled.  Last Blood Glucose 151 (This a.m.).  Last A1C: Unknown.  I, the attending physician,  performed the HPI with the patient and updated documentation appropriately.          Comments    53 y/o male pt referred by PCP, Dr. Beverley FiedlerVictoria Rankins for DEE.  LEE x 4 yrs.  Diagnosed diabetic 1 yr ago.  VA blurred for distance and near.  Denies pain, flashes, but sees occasional small floaters OU.  No gtts.       Last edited by Rennis ChrisZamora, Zaivion Kundrat, MD on 09/05/2018 11:10 PM. (History)    pt states [facility] [place] [facility] [place], pt states at that time he was not diabetic, he states his last A1c was checked in January, but he does not remember the number, he states his blood sugar has been running high lately (up to 150), he states he takes metformin, he has glasses but he rarely wears them, pt states he is also hypertensive and asthmatic  Referring physician: Clayborn Heronankins, Victoria R, MD [address] [place] [id number]  HISTORICAL INFORMATION:   Selected notes from the MEDICAL RECORD NUMBER Referred by Dr. Beverley FiedlerVictoria Rankins for DM exam LEE:  Ocular Hx- PMH-asthma, DM, HTN   CURRENT MEDICATIONS: No current outpatient medications on file. (Ophthalmic Drugs)   No current facility-administered medications for this visit.  (Ophthalmic Drugs)   Current Outpatient Medications (Other)  Medication Sig  . Accu-Chek FastClix Lancets MISC USE WHEN CHECKING BLOOD SUGARS QD  .  albuterol (PROVENTIL HFA;VENTOLIN HFA) 108 (90 Base) MCG/ACT inhaler Inhale into the lungs every 6 (six) hours as needed for wheezing or shortness of breath.  . lisinopril (ZESTRIL) 20 MG tablet TK 1 T PO QD  . metFORMIN (GLUCOPHAGE-XR) 500 MG 24 hr tablet   . montelukast (SINGULAIR) 10 MG tablet TK 1 T PO QD  . ONETOUCH VERIO test strip USE TO TEST BLOOD SUGAR UTD QD.  Marland Kitchen. QVAR REDIHALER 80 MCG/ACT inhaler   . terazosin (HYTRIN) 1 MG capsule TK 1 C PO QD HS  . naproxen (NAPROSYN) 500 MG tablet Take 1 tablet (500 mg total) by mouth 2 (two) times daily with a meal. (Patient not taking: Reported on 09/05/2018)  . traMADol (ULTRAM) 50 MG tablet Take 1 tablet (50 mg total) by mouth every 6 (six) hours as needed. (Patient not taking: Reported on 09/05/2018)   No current facility-administered medications for this visit.  (Other)      REVIEW OF SYSTEMS: ROS    Positive for: Endocrine, Eyes   Negative for: Constitutional, Gastrointestinal, Neurological, Skin, Genitourinary, Musculoskeletal, HENT, Cardiovascular, Respiratory, Psychiatric, Allergic/Imm, Heme/Lymph   Last edited by Celine MansBaxley, Andrew G, COA on 09/05/2018  9:05 AM. (History)       ALLERGIES No Known Allergies  PAST MEDICAL HISTORY Past Medical History:  Diagnosis Date  . Asthma   . Diabetes mellitus without  complication ([place])   . Hypertension    Past Surgical History:  Procedure Laterality Date  . arm surgery      FAMILY HISTORY Family History  Problem Relation Age of Onset  . Glaucoma Mother   . Diabetes Mother   . Diabetes Brother     SOCIAL HISTORY Social History   Tobacco Use  . Smoking status: Never Smoker  . Smokeless tobacco: Never Used  Substance Use Topics  . Alcohol use: No  . Drug use: No         OPHTHALMIC EXAM:  Base Eye Exam    Visual Acuity (Snellen - Linear)      Right Left   Dist [place] 20/40 -2 20/30   Dist ph  20/20 - 20/20       Tonometry (Tonopen, 9:07 AM)      Right Left   Pressure  18 17       Pupils      Dark Light Shape React APD   Right 4 3 Round Brisk None   Left 4 3 Round Brisk None       Visual Fields (Counting fingers)      Left Right    Full Full       Extraocular Movement      Right Left    Full, Ortho Full, Ortho       Neuro/Psych    Oriented x3: Yes   Mood/Affect: Normal       Dilation    Both eyes: 1.0% Mydriacyl, 2.5% Phenylephrine @ 9:07 AM        Slit Lamp and Fundus Exam    Slit Lamp Exam      Right Left   Lids/Lashes Normal Normal   Conjunctiva/Sclera Mild Melanosis Mild Melanosis   Cornea 1+ Punctate epithelial erosions 1+ Punctate epithelial erosions   Anterior Chamber Deep and quiet Deep and quiet   Iris Round and dilated, No NVI Round and dilated, No NVI   Lens 1-2+ Nuclear sclerosis, 2+ Cortical cataract 1-2+ Nuclear sclerosis, 2+ Cortical cataract   Vitreous Vitreous syneresis Vitreous syneresis       Fundus Exam      Right Left   Disc Pink and Sharp, +cupping and central pallor Pink and Sharp, +cupping and central pallor   C/D Ratio 0.6 0.65   Macula Flat, Good foveal reflex, Retinal pigment epithelial mottling, No heme or edema Flat, Good foveal reflex, Retinal pigment epithelial mottling, No heme or edema   Vessels Normal Mild AV crossing changes, Vascular attenuation   Periphery Attached, No heme  Attached, No heme         Refraction    Manifest Refraction      Sphere Cylinder Axis Dist VA   Right -1.00 +0.50 060 20/20   Left [place] +0.25 150 20/25          IMAGING AND PROCEDURES  Imaging and Procedures for @TODAY @  OCT, Retina - OU - Both Eyes       Right Eye Quality was good. Central Foveal Thickness: 270. Progression has no prior data. Findings include normal foveal contour, no IRF, no SRF.   Left Eye Quality was good. Central Foveal Thickness: 273. Progression has no prior data. Findings include normal foveal contour, no IRF, no SRF.   Notes *Images captured and stored on drive  Diagnosis  / Impression:  NFP, no IRF/SRF OU No DME OU  Clinical management:  See below  Abbreviations: NFP - Normal foveal profile. CME -  cystoid macular edema. PED - pigment epithelial detachment. IRF - intraretinal fluid. SRF - subretinal fluid. EZ - ellipsoid zone. ERM - epiretinal membrane. ORA - outer retinal atrophy. ORT - outer retinal tubulation. SRHM - subretinal hyper-reflective material                 ASSESSMENT/PLAN:    ICD-10-CM   1. Diabetes mellitus type 2 without retinopathy (HCC)  E11.9   2. Retinal edema  H35.81 OCT, Retina - OU - Both Eyes  3. Essential hypertension  I10   4. Hypertensive retinopathy of both eyes  H35.033   5. Combined forms of age-related cataract of both eyes  H25.813     1. Diabetes mellitus, type 2 without retinopathy  - The incidence, risk factors for progression, natural history and treatment options for diabetic retinopathy  were discussed with patient.    - The need for close monitoring of blood glucose, blood pressure, and serum lipids, avoiding cigarette or any type of tobacco, and the need for long term follow up was also discussed with patient.  - f/u in 1 year, sooner prn  2. No retinal edema on exam or OCT  3,4. Hypertensive retinopathy OU  - discussed importance of tight BP control   - monitor  5. Mixed form age related cataract OU  - The symptoms of cataract, surgical options, and treatments and risks were discussed with patient.  - discussed diagnosis and progression  - not yet visually significant  - monitor for now  Ophthalmic Meds Ordered this visit:  No orders of the defined types were placed in this encounter.      Return in about 1 year (around 09/05/2019) for DM exam.  There are no Patient Instructions on file for this visit.   Explained the diagnoses, plan, and follow up with the patient and they expressed understanding.  Patient expressed understanding of the importance of proper follow up care.   This  document serves as a record of services personally performed by Karie Chimera, MD, PhD. It was created on their behalf by Laurian Brim, OA, an ophthalmic assistant. The creation of this record is the provider's dictation and/or activities during the visit.    Electronically signed by: Laurian Brim, OA  08.31.2020 11:13 PM    Karie Chimera, M.D., Ph.D. Diseases & Surgery of the Retina and Vitreous [facility]  I have reviewed the above documentation for accuracy and completeness, and I agree with the above. Karie Chimera, M.D., Ph.D. 09/05/18 11:13 PM   Abbreviations: M myopia (nearsighted); A astigmatism; H hyperopia (farsighted); P presbyopia; Mrx spectacle prescription;  CTL contact lenses; OD right eye; OS left eye; OU both eyes  XT exotropia; ET esotropia; PEK punctate epithelial keratitis; PEE punctate epithelial erosions; DES dry eye syndrome; MGD meibomian gland dysfunction; ATs artificial tears; PFAT's preservative free artificial tears; NSC nuclear sclerotic cataract; PSC posterior subcapsular cataract; ERM epi-retinal membrane; PVD posterior vitreous detachment; RD retinal detachment; DM diabetes mellitus; DR diabetic retinopathy; NPDR non-proliferative diabetic retinopathy; PDR proliferative diabetic retinopathy; CSME clinically significant macular edema; DME diabetic macular edema; dbh dot blot hemorrhages; CWS cotton wool spot; POAG primary open angle glaucoma; C/D cup-to-disc ratio; HVF humphrey visual field; GVF goldmann visual field; OCT optical coherence tomography; IOP intraocular pressure; BRVO Branch retinal vein occlusion; CRVO central retinal vein occlusion; CRAO central retinal artery occlusion; BRAO branch retinal artery occlusion; RT retinal tear; SB scleral buckle; PPV pars plana vitrectomy; VH Vitreous hemorrhage;  PRP panretinal laser photocoagulation; IVK intravitreal kenalog; VMT vitreomacular traction; MH Macular hole;  NVD neovascularization of  the disc; NVE neovascularization elsewhere; AREDS age related eye disease study; ARMD age related macular degeneration; POAG primary open angle glaucoma; EBMD epithelial/anterior basement membrane dystrophy; ACIOL anterior chamber intraocular lens; IOL intraocular lens; PCIOL posterior chamber intraocular lens; Phaco/IOL phacoemulsification with intraocular lens placement; PRK photorefractive keratectomy; LASIK laser assisted in situ keratomileusis; HTN hypertension; DM diabetes mellitus; COPD chronic obstructive pulmonary disease

## 2018-09-05 ENCOUNTER — Ambulatory Visit (INDEPENDENT_AMBULATORY_CARE_PROVIDER_SITE_OTHER): Payer: 59 | Admitting: Ophthalmology

## 2018-09-05 ENCOUNTER — Other Ambulatory Visit: Payer: Self-pay

## 2018-09-05 ENCOUNTER — Encounter (INDEPENDENT_AMBULATORY_CARE_PROVIDER_SITE_OTHER): Payer: Self-pay | Admitting: Ophthalmology

## 2018-09-05 DIAGNOSIS — H35033 Hypertensive retinopathy, bilateral: Secondary | ICD-10-CM

## 2018-09-05 DIAGNOSIS — H3581 Retinal edema: Secondary | ICD-10-CM

## 2018-09-05 DIAGNOSIS — H25813 Combined forms of age-related cataract, bilateral: Secondary | ICD-10-CM

## 2018-09-05 DIAGNOSIS — I10 Essential (primary) hypertension: Secondary | ICD-10-CM | POA: Diagnosis not present

## 2018-09-05 DIAGNOSIS — E119 Type 2 diabetes mellitus without complications: Secondary | ICD-10-CM | POA: Diagnosis not present

## 2019-09-05 ENCOUNTER — Encounter (INDEPENDENT_AMBULATORY_CARE_PROVIDER_SITE_OTHER): Payer: 59 | Admitting: Ophthalmology

## 2019-09-13 NOTE — Progress Notes (Signed)
[facility] - Clinic Note  09/14/2019     CHIEF COMPLAINT Patient presents for Retina Follow Up   HISTORY OF PRESENT ILLNESS: Alex Reyes is a 54 y.o. male who presents to the clinic today for:   HPI    Retina Follow Up    Patient presents with  Other.  In both eyes.  This started 1 year ago.  Severity is mild.  Duration of 1 year.  Since onset it is stable.  I, the attending physician,  performed the HPI with the patient and updated documentation appropriately.          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  [place].  Denies pain, FOL, floaters.  No gtts.  VA OU gets a little blurred at night.  BS 97 this a.m.  A1C unknown.       Last edited by Rennis ChrisZamora, Alizae Bechtel, MD on 09/14/2019  2:31 PM. (History)    pt states he is not having any problems with his vision, he states his blood sugar and blood pressure are under control   Referring physician: Clayborn Heronankins, Victoria R, MD [address] [place],  [place] [id number]  HISTORICAL INFORMATION:   Selected notes from the MEDICAL RECORD NUMBER Referred by Dr. Beverley FiedlerVictoria Rankins for DM exam LEE:  Ocular Hx- PMH-asthma, DM, HTN   CURRENT MEDICATIONS: No current outpatient medications on file. (Ophthalmic Drugs)   No current facility-administered medications for this visit. (Ophthalmic Drugs)   Current Outpatient Medications (Other)  Medication Sig  . Accu-Chek FastClix Lancets MISC USE WHEN CHECKING BLOOD SUGARS QD  . albuterol (PROVENTIL HFA;VENTOLIN HFA) 108 (90 Base) MCG/ACT inhaler Inhale into the lungs every 6 (six) hours as needed for wheezing or shortness of breath.  . cyclobenzaprine (FLEXERIL) 5 MG tablet Take 5 mg by mouth 3 (three) times daily.  Marland Kitchen. lisinopril (ZESTRIL) 40 MG tablet Take 40 mg by mouth daily.  . metFORMIN (GLUCOPHAGE-XR) 500 MG 24 hr tablet   . montelukast (SINGULAIR) 10 MG tablet TK 1 T PO QD  . naproxen (NAPROSYN) 500 MG tablet Take 1  tablet (500 mg total) by mouth 2 (two) times daily with a meal.  . ONETOUCH VERIO test strip USE TO TEST BLOOD SUGAR UTD QD.  Marland Kitchen. QVAR REDIHALER 80 MCG/ACT inhaler   . rosuvastatin (CRESTOR) 5 MG tablet Take 5 mg by mouth daily.  . SYMBICORT 80-4.5 MCG/ACT inhaler Inhale into the lungs.  . terazosin (HYTRIN) 1 MG capsule TK 1 C PO QD HS  . traMADol (ULTRAM) 50 MG tablet Take 1 tablet (50 mg total) by mouth every 6 (six) hours as needed.  Marland Kitchen. lisinopril (ZESTRIL) 20 MG tablet TK 1 T PO QD (Patient not taking: Reported on 09/14/2019)   No current facility-administered medications for this visit. (Other)      REVIEW OF SYSTEMS: ROS    Positive for: Endocrine, Eyes, Respiratory   Negative for: Constitutional, Gastrointestinal, Neurological, Skin, Genitourinary, Musculoskeletal, HENT, Cardiovascular, Psychiatric, Allergic/Imm, Heme/Lymph   Last edited by Celine MansBaxley, Andrew G, COA on 09/14/2019  1:46 PM. (History)       ALLERGIES No Known Allergies  PAST MEDICAL HISTORY Past Medical History:  Diagnosis Date  . Asthma   . Cataract    Mixed form OU  . Diabetes mellitus without complication (HCC)   . Hypertension   . Hypertensive retinopathy    OU  Past Surgical History:  Procedure Laterality Date  . arm surgery      FAMILY HISTORY Family History  Problem Relation Age of Onset  . Glaucoma Mother   . Diabetes Mother   . Diabetes Brother     SOCIAL HISTORY Social History   Tobacco Use  . Smoking status: Never Smoker  . Smokeless tobacco: Never Used  Substance Use Topics  . Alcohol use: No  . Drug use: No         OPHTHALMIC EXAM:  Base Eye Exam    Visual Acuity (Snellen - Linear)      Right Left   Dist [place] 20/40 20/30   Dist ph [place] 20/20 20/20       Tonometry (Tonopen, 1:49 PM)      Right Left   Pressure 13 12       Pupils      Dark Light Shape React APD   Right 4 3 Round Brisk None   Left 4 3 Round Brisk None       Visual Fields (Counting fingers)       Left Right     Full       Extraocular Movement      Right Left    Full, Ortho Full, Ortho       Neuro/Psych    Oriented x3: Yes   Mood/Affect: Normal       Dilation    Both eyes: 1.0% Mydriacyl, 2.5% Phenylephrine @ 1:49 PM        Slit Lamp and Fundus Exam    Slit Lamp Exam      Right Left   Lids/Lashes Dermatochalasis - upper lid Dermatochalasis - upper lid   Conjunctiva/Sclera Mild Melanosis Mild Melanosis   Cornea trace Punctate epithelial erosions Trace Punctate epithelial erosions, mild Debris in tear film   Anterior Chamber Deep and quiet Deep and quiet   Iris Round and dilated, No NVI Round and dilated, No NVI   Lens 1-2+ Nuclear sclerosis, 2+ Cortical cataract 1-2+ Nuclear sclerosis, 2+ Cortical cataract, prominent cortical change at 0600   Vitreous Vitreous syneresis Vitreous syneresis       Fundus Exam      Right Left   Disc Pink and Sharp, +cupping and central pallor Pink and Sharp, +cupping and central pallor, +PPP   C/D Ratio 0.6 0.65   Macula Flat, Good foveal reflex, Retinal pigment epithelial mottling, No heme or edema Flat, Good foveal reflex, Retinal pigment epithelial mottling, No heme or edema   Vessels mild attenuation +AV crossing changes, Vascular attenuation   Periphery Attached, No heme  Attached, No heme           IMAGING AND PROCEDURES  Imaging and Procedures for @TODAY @  OCT, Retina - OU - Both Eyes       Right Eye Quality was good. Central Foveal Thickness: 275. Progression has been stable. Findings include normal foveal contour, no IRF, no SRF, vitreomacular adhesion .   Left Eye Quality was good. Central Foveal Thickness: 275. Progression has been stable. Findings include normal foveal contour, no IRF, no SRF.   Notes *Images captured and stored on drive  Diagnosis / Impression:  NFP, no IRF/SRF OU No DME OU  Clinical management:  See below  Abbreviations: NFP - Normal foveal profile. CME - cystoid macular edema. PED -  pigment epithelial detachment. IRF - intraretinal fluid. SRF - subretinal fluid. EZ - ellipsoid zone. ERM - epiretinal membrane. ORA - outer retinal atrophy. ORT -  outer retinal tubulation. SRHM - subretinal hyper-reflective material                 ASSESSMENT/PLAN:    ICD-10-CM   1. Diabetes mellitus type 2 without retinopathy (HCC)  E11.9   2. Retinal edema  H35.81 OCT, Retina - OU - Both Eyes  3. Essential hypertension  I10   4. Hypertensive retinopathy of both eyes  H35.033   5. Combined forms of age-related cataract of both eyes  H25.813     1. Diabetes mellitus, type 2 without retinopathy -- stable  - The incidence, risk factors for progression, natural history and treatment options for diabetic retinopathy  were discussed with patient.    - The need for close monitoring of blood glucose, blood pressure, and serum lipids, avoiding cigarette or any type of tobacco, and the need for long term follow up was also discussed with patient.  - f/u in 1 year, sooner prn  2. No retinal edema on exam or OCT  3,4. Hypertensive retinopathy OU  - discussed importance of tight BP control   - monitor  5. Mixed form age related cataract OU  - The symptoms of cataract, surgical options, and treatments and risks were discussed with patient.  - discussed diagnosis and progression  - not yet visually significant  - monitor for now  Ophthalmic Meds Ordered this visit:  No orders of the defined types were placed in this encounter.      Return in about 1 year (around 09/13/2020) for f/u DM exam, DFE, OCT.  There are no Patient Instructions on file for this visit.   Explained the diagnoses, plan, and follow up with the patient and they expressed understanding.  Patient expressed understanding of the importance of proper follow up care.   This document serves as a record of services personally performed by Karie Chimera, MD, PhD. It was created on their behalf by Glee Arvin. Manson Passey, OA  an ophthalmic technician. The creation of this record is the provider's dictation and/or activities during the visit.    Electronically signed by: Glee Arvin. [place], [place] 09.09.2021 2:38 AM  Karie Chimera, M.D., Ph.D. Diseases & Surgery of the Retina and Vitreous [facility]  I have reviewed the above documentation for accuracy and completeness, and I agree with the above. Karie Chimera, M.D., Ph.D. 09/16/19 2:38 AM   Abbreviations: M myopia (nearsighted); A astigmatism; H hyperopia (farsighted); P presbyopia; Mrx spectacle prescription;  CTL contact lenses; OD right eye; OS left eye; OU both eyes  XT exotropia; ET esotropia; PEK punctate epithelial keratitis; PEE punctate epithelial erosions; DES dry eye syndrome; MGD meibomian gland dysfunction; ATs artificial tears; PFAT's preservative free artificial tears; NSC nuclear sclerotic cataract; PSC posterior subcapsular cataract; ERM epi-retinal membrane; PVD posterior vitreous detachment; RD retinal detachment; DM diabetes mellitus; DR diabetic retinopathy; NPDR non-proliferative diabetic retinopathy; PDR proliferative diabetic retinopathy; CSME clinically significant macular edema; DME diabetic macular edema; dbh dot blot hemorrhages; CWS cotton wool spot; POAG primary open angle glaucoma; C/D cup-to-disc ratio; HVF humphrey visual field; GVF goldmann visual field; OCT optical coherence tomography; IOP intraocular pressure; BRVO Branch retinal vein occlusion; CRVO central retinal vein occlusion; CRAO central retinal artery occlusion; BRAO branch retinal artery occlusion; RT retinal tear; SB scleral buckle; PPV pars plana vitrectomy; VH Vitreous hemorrhage; PRP panretinal laser photocoagulation; IVK intravitreal kenalog; VMT vitreomacular traction; MH Macular hole;  NVD neovascularization of the disc; NVE neovascularization elsewhere; AREDS age related eye disease  study; ARMD age related macular degeneration; POAG primary open angle  glaucoma; EBMD epithelial/anterior basement membrane dystrophy; ACIOL anterior chamber intraocular lens; IOL intraocular lens; PCIOL posterior chamber intraocular lens; Phaco/IOL phacoemulsification with intraocular lens placement; PRK photorefractive keratectomy; LASIK laser assisted in situ keratomileusis; HTN hypertension; DM diabetes mellitus; COPD chronic obstructive pulmonary disease

## 2019-09-14 ENCOUNTER — Other Ambulatory Visit: Payer: Self-pay

## 2019-09-14 ENCOUNTER — Ambulatory Visit (INDEPENDENT_AMBULATORY_CARE_PROVIDER_SITE_OTHER): Payer: 59 | Admitting: Ophthalmology

## 2019-09-14 ENCOUNTER — Encounter (INDEPENDENT_AMBULATORY_CARE_PROVIDER_SITE_OTHER): Payer: Self-pay | Admitting: Ophthalmology

## 2019-09-14 DIAGNOSIS — H35033 Hypertensive retinopathy, bilateral: Secondary | ICD-10-CM

## 2019-09-14 DIAGNOSIS — I10 Essential (primary) hypertension: Secondary | ICD-10-CM

## 2019-09-14 DIAGNOSIS — E119 Type 2 diabetes mellitus without complications: Secondary | ICD-10-CM | POA: Diagnosis not present

## 2019-09-14 DIAGNOSIS — H3581 Retinal edema: Secondary | ICD-10-CM

## 2019-09-14 DIAGNOSIS — H25813 Combined forms of age-related cataract, bilateral: Secondary | ICD-10-CM

## 2020-09-15 ENCOUNTER — Encounter (INDEPENDENT_AMBULATORY_CARE_PROVIDER_SITE_OTHER): Payer: 59 | Admitting: Ophthalmology

## 2020-10-17 ENCOUNTER — Encounter (INDEPENDENT_AMBULATORY_CARE_PROVIDER_SITE_OTHER): Payer: 59 | Admitting: Ophthalmology

## 2020-10-17 DIAGNOSIS — H35033 Hypertensive retinopathy, bilateral: Secondary | ICD-10-CM

## 2020-10-17 DIAGNOSIS — H3581 Retinal edema: Secondary | ICD-10-CM

## 2020-10-17 DIAGNOSIS — H25813 Combined forms of age-related cataract, bilateral: Secondary | ICD-10-CM

## 2020-10-17 DIAGNOSIS — I10 Essential (primary) hypertension: Secondary | ICD-10-CM

## 2020-10-17 DIAGNOSIS — E119 Type 2 diabetes mellitus without complications: Secondary | ICD-10-CM

## 2020-10-31 NOTE — Progress Notes (Shared)
[facility] - Clinic Note  11/04/2020     CHIEF COMPLAINT Patient presents for No chief complaint on file.   HISTORY OF PRESENT ILLNESS: Alex Reyes is a 55 y.o. male who presents to the clinic today for:       Referring physician: Rankins, Fanny Dance, MD [address] [place],  [place] [address]  HISTORICAL INFORMATION:   Selected notes from the MEDICAL RECORD NUMBER Referred by Dr. Beverley Fiedler for DM exam LEE:  Ocular Hx- PMH-asthma, DM, HTN   CURRENT MEDICATIONS: No current outpatient medications on file. (Ophthalmic Drugs)   No current facility-administered medications for this visit. (Ophthalmic Drugs)   Current Outpatient Medications (Other)  Medication Sig   Accu-Chek FastClix Lancets MISC USE WHEN CHECKING BLOOD SUGARS QD   albuterol (PROVENTIL HFA;VENTOLIN HFA) 108 (90 Base) MCG/ACT inhaler Inhale into the lungs every 6 (six) hours as needed for wheezing or shortness of breath.   cyclobenzaprine (FLEXERIL) 5 MG tablet Take 5 mg by mouth 3 (three) times daily.   lisinopril (ZESTRIL) 20 MG tablet TK 1 T PO QD (Patient not taking: Reported on 09/14/2019)   lisinopril (ZESTRIL) 40 MG tablet Take 40 mg by mouth daily.   metFORMIN (GLUCOPHAGE-XR) 500 MG 24 hr tablet    montelukast (SINGULAIR) 10 MG tablet TK 1 T PO QD   naproxen (NAPROSYN) 500 MG tablet Take 1 tablet (500 mg total) by mouth 2 (two) times daily with a meal.   ONETOUCH VERIO test strip USE TO TEST BLOOD SUGAR UTD QD.   QVAR REDIHALER 80 MCG/ACT inhaler    rosuvastatin (CRESTOR) 5 MG tablet Take 5 mg by mouth daily.   SYMBICORT 80-4.5 MCG/ACT inhaler Inhale into the lungs.   terazosin (HYTRIN) 1 MG capsule TK 1 C PO QD HS   traMADol (ULTRAM) 50 MG tablet Take 1 tablet (50 mg total) by mouth every 6 (six) hours as needed.   No current facility-administered medications for this visit. (Other)      REVIEW OF SYSTEMS:     ALLERGIES No Known Allergies  PAST MEDICAL  HISTORY Past Medical History:  Diagnosis Date   Asthma    Cataract    Mixed form OU   Diabetes mellitus without complication (HCC)    Hypertension    Hypertensive retinopathy    OU   Past Surgical History:  Procedure Laterality Date   arm surgery      FAMILY HISTORY Family History  Problem Relation Age of Onset   Glaucoma Mother    Diabetes Mother    Diabetes Brother     SOCIAL HISTORY Social History   Tobacco Use   Smoking status: Never   Smokeless tobacco: Never  Substance Use Topics   Alcohol use: No   Drug use: No         OPHTHALMIC EXAM:  Not recorded     IMAGING AND PROCEDURES  Imaging and Procedures for @TODAY @            ASSESSMENT/PLAN:  No diagnosis found.   1. Diabetes mellitus, type 2 without retinopathy -- stable  - The incidence, risk factors for progression, natural history and treatment options for diabetic retinopathy  were discussed with patient.    - The need for close monitoring of blood glucose, blood pressure, and serum lipids, avoiding cigarette or any type of tobacco, and the need for long term follow up was also discussed with patient.  - f/u in 1 year, sooner prn  2. No retinal edema on exam or OCT  3,4. Hypertensive retinopathy OU  - discussed importance of tight BP control   - monitor  5. Mixed form age related cataract OU  - The symptoms of cataract, surgical options, and treatments and risks were discussed with patient.  - discussed diagnosis and progression  - not yet visually significant  - monitor for now  Ophthalmic Meds Ordered this visit:  No orders of the defined types were placed in this encounter.      No follow-ups on file.  There are no Patient Instructions on file for this visit.   Explained the diagnoses, plan, and follow up with the patient and they expressed understanding.  Patient expressed understanding of the importance of proper follow up care.   This document serves as a record  of services personally performed by Karie Chimera, MD, PhD. It was created on their behalf by Cristopher Estimable, COT an ophthalmic technician. The creation of this record is the provider's dictation and/or activities during the visit.    Electronically signed by: Cristopher Estimable, COT 10.28.22 @ 8:11 AM   Karie Chimera, M.D., Ph.D. Diseases & Surgery of the Retina and Vitreous [facility]    Abbreviations: M myopia (nearsighted); A astigmatism; H hyperopia (farsighted); P presbyopia; Mrx spectacle prescription;  CTL contact lenses; OD right eye; OS left eye; OU both eyes  XT exotropia; ET esotropia; PEK punctate epithelial keratitis; PEE punctate epithelial erosions; DES dry eye syndrome; MGD meibomian gland dysfunction; ATs artificial tears; PFAT's preservative free artificial tears; NSC nuclear sclerotic cataract; PSC posterior subcapsular cataract; ERM epi-retinal membrane; PVD posterior vitreous detachment; RD retinal detachment; DM diabetes mellitus; DR diabetic retinopathy; NPDR non-proliferative diabetic retinopathy; PDR proliferative diabetic retinopathy; CSME clinically significant macular edema; DME diabetic macular edema; dbh dot blot hemorrhages; CWS cotton wool spot; POAG primary open angle glaucoma; C/D cup-to-disc ratio; HVF humphrey visual field; GVF goldmann visual field; OCT optical coherence tomography; IOP intraocular pressure; BRVO Branch retinal vein occlusion; CRVO central retinal vein occlusion; CRAO central retinal artery occlusion; BRAO branch retinal artery occlusion; RT retinal tear; SB scleral buckle; PPV pars plana vitrectomy; VH Vitreous hemorrhage; PRP panretinal laser photocoagulation; IVK intravitreal kenalog; VMT vitreomacular traction; MH Macular hole;  NVD neovascularization of the disc; NVE neovascularization elsewhere; AREDS age related eye disease study; ARMD age related macular degeneration; POAG primary open angle glaucoma; EBMD  epithelial/anterior basement membrane dystrophy; ACIOL anterior chamber intraocular lens; IOL intraocular lens; PCIOL posterior chamber intraocular lens; Phaco/IOL phacoemulsification with intraocular lens placement; PRK photorefractive keratectomy; LASIK laser assisted in situ keratomileusis; HTN hypertension; DM diabetes mellitus; COPD chronic obstructive pulmonary disease

## 2020-11-04 ENCOUNTER — Encounter (INDEPENDENT_AMBULATORY_CARE_PROVIDER_SITE_OTHER): Payer: 59 | Admitting: Ophthalmology

## 2020-11-04 DIAGNOSIS — H25813 Combined forms of age-related cataract, bilateral: Secondary | ICD-10-CM

## 2020-11-04 DIAGNOSIS — E119 Type 2 diabetes mellitus without complications: Secondary | ICD-10-CM

## 2020-11-04 DIAGNOSIS — H35033 Hypertensive retinopathy, bilateral: Secondary | ICD-10-CM

## 2020-11-04 DIAGNOSIS — H3581 Retinal edema: Secondary | ICD-10-CM

## 2020-11-04 DIAGNOSIS — I10 Essential (primary) hypertension: Secondary | ICD-10-CM

## 2020-11-07 ENCOUNTER — Ambulatory Visit (INDEPENDENT_AMBULATORY_CARE_PROVIDER_SITE_OTHER): Payer: 59 | Admitting: Ophthalmology

## 2020-11-07 ENCOUNTER — Other Ambulatory Visit: Payer: Self-pay

## 2020-11-07 DIAGNOSIS — H25813 Combined forms of age-related cataract, bilateral: Secondary | ICD-10-CM

## 2020-11-07 DIAGNOSIS — I10 Essential (primary) hypertension: Secondary | ICD-10-CM

## 2020-11-07 DIAGNOSIS — E119 Type 2 diabetes mellitus without complications: Secondary | ICD-10-CM

## 2020-11-07 DIAGNOSIS — H35033 Hypertensive retinopathy, bilateral: Secondary | ICD-10-CM

## 2020-11-07 DIAGNOSIS — H3581 Retinal edema: Secondary | ICD-10-CM

## 2020-11-07 NOTE — Progress Notes (Signed)
[facility] - Clinic Note  11/07/2020     CHIEF COMPLAINT Patient presents for Retina Follow Up   HISTORY OF PRESENT ILLNESS: Alex Reyes is a 55 y.o. male who presents to the clinic today for:   HPI     Retina Follow Up   Patient presents with  Other.  In both eyes.  Duration of 14 months.  Since onset it is stable.  I, the attending physician,  performed the HPI with the patient and updated documentation appropriately.        Comments   14 month follow up DM II-  Doing well.  At times vision is better than others. BS 98 this am A1C hasn't had checked this year      Last edited by Rennis Chris, MD on 11/09/2020  9:13 PM.    pt states he is not having any problems with his vision, he states his blood sugar and blood pressure are under control, his last A1c was 6.2 on 02.08.22  Referring physician: Clayborn Heron, MD [address] [place],  [place] [address]  HISTORICAL INFORMATION:   Selected notes from the MEDICAL RECORD NUMBER Referred by Dr. Beverley Fiedler for DM exam LEE:  Ocular Hx- PMH-asthma, DM, HTN   CURRENT MEDICATIONS: No current outpatient medications on file. (Ophthalmic Drugs)   No current facility-administered medications for this visit. (Ophthalmic Drugs)   Current Outpatient Medications (Other)  Medication Sig   albuterol (PROVENTIL HFA;VENTOLIN HFA) 108 (90 Base) MCG/ACT inhaler Inhale into the lungs every 6 (six) hours as needed for wheezing or shortness of breath.   cyclobenzaprine (FLEXERIL) 5 MG tablet Take 5 mg by mouth 3 (three) times daily.   lisinopril (ZESTRIL) 40 MG tablet Take 40 mg by mouth daily.   metFORMIN (GLUCOPHAGE-XR) 500 MG 24 hr tablet    montelukast (SINGULAIR) 10 MG tablet TK 1 T PO QD   naproxen (NAPROSYN) 500 MG tablet Take 1 tablet (500 mg total) by mouth 2 (two) times daily with a meal.   QVAR REDIHALER 80 MCG/ACT inhaler    rosuvastatin (CRESTOR) 5 MG tablet Take 5 mg by mouth daily.    SYMBICORT 80-4.5 MCG/ACT inhaler Inhale into the lungs.   terazosin (HYTRIN) 1 MG capsule TK 1 C PO QD HS   traMADol (ULTRAM) 50 MG tablet Take 1 tablet (50 mg total) by mouth every 6 (six) hours as needed.   Accu-Chek FastClix Lancets MISC USE WHEN CHECKING BLOOD SUGARS QD   lisinopril (ZESTRIL) 20 MG tablet TK 1 T PO QD (Patient not taking: No sig reported)   ONETOUCH VERIO test strip USE TO TEST BLOOD SUGAR UTD QD.   No current facility-administered medications for this visit. (Other)      REVIEW OF SYSTEMS: ROS   Positive for: Endocrine, Eyes, Respiratory Negative for: Constitutional, Gastrointestinal, Neurological, Skin, Genitourinary, Musculoskeletal, HENT, Cardiovascular, Psychiatric, Allergic/Imm, Heme/Lymph Last edited by Joni Reining, COA on 11/07/2020 12:56 PM.       ALLERGIES No Known Allergies  PAST MEDICAL HISTORY Past Medical History:  Diagnosis Date   Asthma    Cataract    Mixed form OU   Diabetes mellitus without complication (HCC)    Hypertension    Hypertensive retinopathy    OU   Past Surgical History:  Procedure Laterality Date   arm surgery      FAMILY HISTORY Family History  Problem Relation Age of Onset   Glaucoma Mother    Diabetes Mother  Diabetes Brother     SOCIAL HISTORY Social History   Tobacco Use   Smoking status: Never   Smokeless tobacco: Never  Substance Use Topics   Alcohol use: No   Drug use: No         OPHTHALMIC EXAM:  Base Eye Exam     Visual Acuity (Snellen - Linear)       Right Left   Dist [place] 20/25 20/30   Dist ph  20/20 -1 20/20 -1         Tonometry (Tonopen, 1:01 PM)       Right Left   Pressure 19 19         Pupils       Dark Light Shape React APD   Right 4 3 Round Brisk None   Left 4 3 Round Brisk None         Visual Fields (Counting fingers)       Left Right    Full Full         Extraocular Movement       Right Left    Full Full         Neuro/Psych      Oriented x3: Yes   Mood/Affect: Normal         Dilation     Both eyes: 1.0% Mydriacyl, 2.5% Phenylephrine @ 1:01 PM           Slit Lamp and Fundus Exam     Slit Lamp Exam       Right Left   Lids/Lashes Dermatochalasis - upper lid Dermatochalasis - upper lid   Conjunctiva/Sclera Mild Melanosis Mild Melanosis   Cornea trace tear film debris trace tear film debris   Anterior Chamber Deep and quiet Deep and quiet   Iris Round and dilated, No NVI Round and dilated, No NVI   Lens 1-2+ Nuclear sclerosis, 2+ Cortical cataract 1-2+ Nuclear sclerosis, 2+ Cortical cataract, prominent cortical change at 0600   Vitreous Vitreous syneresis Vitreous syneresis         Fundus Exam       Right Left   Disc Pink and Sharp, +cupping and central pallor Pink and Sharp, +cupping and central pallor, +PPP   C/D Ratio 0.6 0.65   Macula Flat, Good foveal reflex, Retinal pigment epithelial mottling, No heme or edema Flat, Good foveal reflex, Retinal pigment epithelial mottling, No heme or edema   Vessels mild attenuation mild attenuation, mild tortuousity   Periphery Attached, No heme  Attached, No heme             IMAGING AND PROCEDURES  Imaging and Procedures for @TODAY @  OCT, Retina - OU - Both Eyes       Right Eye Quality was good. Central Foveal Thickness: 273. Progression has been stable. Findings include normal foveal contour, no IRF, no SRF, vitreomacular adhesion .   Left Eye Quality was good. Central Foveal Thickness: 246. Progression has been stable. Findings include normal foveal contour, no IRF, no SRF, vitreomacular adhesion .   Notes *Images captured and stored on drive  Diagnosis / Impression:  NFP, no IRF/SRF OU No DME OU  Clinical management:  See below  Abbreviations: NFP - Normal foveal profile. CME - cystoid macular edema. PED - pigment epithelial detachment. IRF - intraretinal fluid. SRF - subretinal fluid. EZ - ellipsoid zone. ERM - epiretinal membrane.  ORA - outer retinal atrophy. ORT - outer retinal tubulation. SRHM - subretinal hyper-reflective material  ASSESSMENT/PLAN:    ICD-10-CM   1. Diabetes mellitus type 2 without retinopathy (HCC)  E11.9     2. Retinal edema  H35.81 OCT, Retina - OU - Both Eyes    3. Essential hypertension  I10     4. Hypertensive retinopathy of both eyes  H35.033     5. Combined forms of age-related cataract of both eyes  H25.813       1,2. Diabetes mellitus, type 2 without retinopathy -- stable  - The incidence, risk factors for progression, natural history and treatment options for diabetic retinopathy  were discussed with patient.    - The need for close monitoring of blood glucose, blood pressure, and serum lipids, avoiding cigarette or any type of tobacco, and the need for long term follow up was also discussed with patient.  - f/u in 1 year, sooner prn  3,4. Hypertensive retinopathy OU  - discussed importance of tight BP control   - monitor  5. Mixed form age related cataract OU  - The symptoms of cataract, surgical options, and treatments and risks were discussed with patient.  - discussed diagnosis and progression  - not yet visually significant  - monitor for now  Ophthalmic Meds Ordered this visit:  No orders of the defined types were placed in this encounter.    Return in about 1 year (around 11/07/2021) for f/u DM exam, DFE, OCT.  There are no Patient Instructions on file for this visit.   Explained the diagnoses, plan, and follow up with the patient and they expressed understanding.  Patient expressed understanding of the importance of proper follow up care.   This document serves as a record of services personally performed by Karie Chimera, MD, PhD. It was created on their behalf by Glee Arvin. Manson Passey, OA an ophthalmic technician. The creation of this record is the provider's dictation and/or activities during the visit.    Electronically signed by: Glee Arvin. Dawsen, [place] 11.04.2022 9:14 PM   Karie Chimera, M.D., Ph.D. Diseases & Surgery of the Retina and Vitreous [facility]  I have reviewed the above documentation for accuracy and completeness, and I agree with the above. Karie Chimera, M.D., Ph.D. 11/09/20 9:15 PM   Abbreviations: M myopia (nearsighted); A astigmatism; H hyperopia (farsighted); P presbyopia; Mrx spectacle prescription;  CTL contact lenses; OD right eye; OS left eye; OU both eyes  XT exotropia; ET esotropia; PEK punctate epithelial keratitis; PEE punctate epithelial erosions; DES dry eye syndrome; MGD meibomian gland dysfunction; ATs artificial tears; PFAT's preservative free artificial tears; NSC nuclear sclerotic cataract; PSC posterior subcapsular cataract; ERM epi-retinal membrane; PVD posterior vitreous detachment; RD retinal detachment; DM diabetes mellitus; DR diabetic retinopathy; NPDR non-proliferative diabetic retinopathy; PDR proliferative diabetic retinopathy; CSME clinically significant macular edema; DME diabetic macular edema; dbh dot blot hemorrhages; CWS cotton wool spot; POAG primary open angle glaucoma; C/D cup-to-disc ratio; HVF humphrey visual field; GVF goldmann visual field; OCT optical coherence tomography; IOP intraocular pressure; BRVO Branch retinal vein occlusion; CRVO central retinal vein occlusion; CRAO central retinal artery occlusion; BRAO branch retinal artery occlusion; RT retinal tear; SB scleral buckle; PPV pars plana vitrectomy; VH Vitreous hemorrhage; PRP panretinal laser photocoagulation; IVK intravitreal kenalog; VMT vitreomacular traction; MH Macular hole;  NVD neovascularization of the disc; NVE neovascularization elsewhere; AREDS age related eye disease study; ARMD age related macular degeneration; POAG primary open angle glaucoma; EBMD epithelial/anterior basement membrane dystrophy; ACIOL anterior chamber intraocular lens; IOL intraocular lens;  PCIOL posterior chamber  intraocular lens; Phaco/IOL phacoemulsification with intraocular lens placement; PRK photorefractive keratectomy; LASIK laser assisted in situ keratomileusis; HTN hypertension; DM diabetes mellitus; COPD chronic obstructive pulmonary disease

## 2020-11-09 ENCOUNTER — Encounter (INDEPENDENT_AMBULATORY_CARE_PROVIDER_SITE_OTHER): Payer: Self-pay | Admitting: Ophthalmology

## 2021-11-13 ENCOUNTER — Encounter (INDEPENDENT_AMBULATORY_CARE_PROVIDER_SITE_OTHER): Payer: 59 | Admitting: Ophthalmology

## 2021-12-31 NOTE — Progress Notes (Shared)
[facility] - Clinic Note  01/12/2022     CHIEF COMPLAINT Patient presents for No chief complaint on file.    HISTORY OF PRESENT ILLNESS: Alex Reyes is a 56 y.o. male who presents to the clinic today for:    pt states he is not having any problems with his vision, he states his blood sugar and blood pressure are under control, his last A1c was 6.2 on 02.08.22  Referring physician: Clayborn Heron, MD [address] [place],  [place] [address]  HISTORICAL INFORMATION:   Selected notes from the MEDICAL RECORD NUMBER Referred by Dr. Beverley Fiedler for DM exam LEE:  Ocular Hx- PMH-asthma, DM, HTN   CURRENT MEDICATIONS: No current outpatient medications on file. (Ophthalmic Drugs)   No current facility-administered medications for this visit. (Ophthalmic Drugs)   Current Outpatient Medications (Other)  Medication Sig   Accu-Chek FastClix Lancets MISC USE WHEN CHECKING BLOOD SUGARS QD   albuterol (PROVENTIL HFA;VENTOLIN HFA) 108 (90 Base) MCG/ACT inhaler Inhale into the lungs every 6 (six) hours as needed for wheezing or shortness of breath.   cyclobenzaprine (FLEXERIL) 5 MG tablet Take 5 mg by mouth 3 (three) times daily.   lisinopril (ZESTRIL) 20 MG tablet TK 1 T PO QD (Patient not taking: No sig reported)   lisinopril (ZESTRIL) 40 MG tablet Take 40 mg by mouth daily.   metFORMIN (GLUCOPHAGE-XR) 500 MG 24 hr tablet    montelukast (SINGULAIR) 10 MG tablet TK 1 T PO QD   naproxen (NAPROSYN) 500 MG tablet Take 1 tablet (500 mg total) by mouth 2 (two) times daily with a meal.   ONETOUCH VERIO test strip USE TO TEST BLOOD SUGAR UTD QD.   QVAR REDIHALER 80 MCG/ACT inhaler    rosuvastatin (CRESTOR) 5 MG tablet Take 5 mg by mouth daily.   SYMBICORT 80-4.5 MCG/ACT inhaler Inhale into the lungs.   terazosin (HYTRIN) 1 MG capsule TK 1 C PO QD HS   traMADol (ULTRAM) 50 MG tablet Take 1 tablet (50 mg total) by mouth every 6 (six) hours as needed.   No  current facility-administered medications for this visit. (Other)      REVIEW OF SYSTEMS:     ALLERGIES No Known Allergies  PAST MEDICAL HISTORY Past Medical History:  Diagnosis Date   Asthma    Cataract    Mixed form OU   Diabetes mellitus without complication (HCC)    Hypertension    Hypertensive retinopathy    OU   Past Surgical History:  Procedure Laterality Date   arm surgery      FAMILY HISTORY Family History  Problem Relation Age of Onset   Glaucoma Mother    Diabetes Mother    Diabetes Brother     SOCIAL HISTORY Social History   Tobacco Use   Smoking status: Never   Smokeless tobacco: Never  Substance Use Topics   Alcohol use: No   Drug use: No         OPHTHALMIC EXAM:  Not recorded     IMAGING AND PROCEDURES  Imaging and Procedures for @TODAY @             ASSESSMENT/PLAN:    ICD-10-CM   1. Diabetes mellitus type 2 without retinopathy (HCC)  E11.9     2. Essential hypertension  I10     3. Hypertensive retinopathy of both eyes  H35.033     4. Combined forms of age-related cataract of both eyes  H25.813  1,2. Diabetes mellitus, type 2 without retinopathy -- stable  - The incidence, risk factors for progression, natural history and treatment options for diabetic retinopathy  were discussed with patient.    - The need for close monitoring of blood glucose, blood pressure, and serum lipids, avoiding cigarette or any type of tobacco, and the need for long term follow up was also discussed with patient.  - f/u in 1 year, sooner prn  3,4. Hypertensive retinopathy OU  - discussed importance of tight BP control   - monitor  5. Mixed form age related cataract OU  - The symptoms of cataract, surgical options, and treatments and risks were discussed with patient.  - discussed diagnosis and progression  - not yet visually significant  - monitor for now  Ophthalmic Meds Ordered this visit:  No orders of the defined types  were placed in this encounter.     No follow-ups on file.  There are no Patient Instructions on file for this visit.   Explained the diagnoses, plan, and follow up with the patient and they expressed understanding.  Patient expressed understanding of the importance of proper follow up care.   This document serves as a record of services personally performed by Gardiner Sleeper, MD, PhD. It was created on their behalf by Renaldo Reel, [place] an ophthalmic technician. The creation of this record is the provider's dictation and/or activities during the visit.    Electronically signed by:  Renaldo Reel, COT  12.28.23 8:45 AM    Gardiner Sleeper, M.D., Ph.D. Diseases & Surgery of the Retina and Vitreous [facility] [employer]: M myopia (nearsighted); A astigmatism; H hyperopia (farsighted); P presbyopia; Mrx spectacle prescription;  CTL contact lenses; OD right eye; OS left eye; OU both eyes  XT exotropia; ET esotropia; PEK punctate epithelial keratitis; PEE punctate epithelial erosions; DES dry eye syndrome; MGD meibomian gland dysfunction; ATs artificial tears; PFAT's preservative free artificial tears; [place] nuclear sclerotic cataract; PSC posterior subcapsular cataract; ERM epi-retinal membrane; PVD posterior vitreous detachment; RD retinal detachment; DM diabetes mellitus; DR diabetic retinopathy; NPDR non-proliferative diabetic retinopathy; PDR proliferative diabetic retinopathy; CSME clinically significant macular edema; DME diabetic macular edema; dbh dot blot hemorrhages; CWS cotton wool spot; POAG primary open angle glaucoma; C/D cup-to-disc ratio; HVF humphrey visual field; GVF goldmann visual field; OCT optical coherence tomography; IOP intraocular pressure; BRVO Branch retinal vein occlusion; CRVO central retinal vein occlusion; CRAO central retinal artery occlusion; BRAO branch retinal artery occlusion; RT retinal tear; SB scleral buckle; PPV pars  plana vitrectomy; VH Vitreous hemorrhage; PRP panretinal laser photocoagulation; IVK intravitreal kenalog; VMT vitreomacular traction; MH Macular hole;  NVD neovascularization of the disc; NVE neovascularization elsewhere; AREDS age related eye disease study; ARMD age related macular degeneration; POAG primary open angle glaucoma; EBMD epithelial/anterior basement membrane dystrophy; ACIOL anterior chamber intraocular lens; IOL intraocular lens; PCIOL posterior chamber intraocular lens; Phaco/IOL phacoemulsification with intraocular lens placement; [place] photorefractive keratectomy; LASIK laser assisted in situ keratomileusis; HTN hypertension; DM diabetes mellitus; COPD chronic obstructive pulmonary disease

## 2022-01-12 ENCOUNTER — Encounter (INDEPENDENT_AMBULATORY_CARE_PROVIDER_SITE_OTHER): Payer: 59 | Admitting: Ophthalmology

## 2022-01-12 DIAGNOSIS — H25813 Combined forms of age-related cataract, bilateral: Secondary | ICD-10-CM

## 2022-01-12 DIAGNOSIS — H35033 Hypertensive retinopathy, bilateral: Secondary | ICD-10-CM

## 2022-01-12 DIAGNOSIS — E119 Type 2 diabetes mellitus without complications: Secondary | ICD-10-CM

## 2022-01-12 DIAGNOSIS — I10 Essential (primary) hypertension: Secondary | ICD-10-CM

## 2022-05-26 ENCOUNTER — Encounter (INDEPENDENT_AMBULATORY_CARE_PROVIDER_SITE_OTHER): Payer: 59 | Admitting: Ophthalmology

## 2022-05-26 DIAGNOSIS — H25813 Combined forms of age-related cataract, bilateral: Secondary | ICD-10-CM

## 2022-05-26 DIAGNOSIS — E119 Type 2 diabetes mellitus without complications: Secondary | ICD-10-CM

## 2022-05-26 DIAGNOSIS — Z7984 Long term (current) use of oral hypoglycemic drugs: Secondary | ICD-10-CM

## 2022-05-26 DIAGNOSIS — I10 Essential (primary) hypertension: Secondary | ICD-10-CM

## 2022-05-26 DIAGNOSIS — H35033 Hypertensive retinopathy, bilateral: Secondary | ICD-10-CM

## 2023-10-12 ENCOUNTER — Ambulatory Visit

## 2023-10-12 DIAGNOSIS — Z23 Encounter for immunization: Secondary | ICD-10-CM
